# Patient Record
Sex: Female | Born: 1950 | Race: White | Hispanic: No | Marital: Married | Smoking: Never smoker
Health system: Southern US, Community
[De-identification: ages and names within clinical notes are randomized; demographics above are authoritative.]

## PROBLEM LIST (undated history)

## (undated) DIAGNOSIS — M797 Fibromyalgia: Secondary | ICD-10-CM

## (undated) DIAGNOSIS — E039 Hypothyroidism, unspecified: Secondary | ICD-10-CM

## (undated) DIAGNOSIS — K219 Gastro-esophageal reflux disease without esophagitis: Secondary | ICD-10-CM

## (undated) DIAGNOSIS — G510 Bell's palsy: Secondary | ICD-10-CM

## (undated) DIAGNOSIS — I10 Essential (primary) hypertension: Secondary | ICD-10-CM

## (undated) HISTORY — DX: Bell's palsy: G51.0

---

## 2006-04-13 ENCOUNTER — Ambulatory Visit: Payer: Self-pay | Admitting: Cardiology

## 2006-04-21 ENCOUNTER — Ambulatory Visit: Payer: Self-pay | Admitting: Cardiology

## 2006-05-29 ENCOUNTER — Ambulatory Visit: Payer: Self-pay | Admitting: Cardiology

## 2015-05-22 DIAGNOSIS — E039 Hypothyroidism, unspecified: Secondary | ICD-10-CM | POA: Diagnosis not present

## 2015-06-11 DIAGNOSIS — H35033 Hypertensive retinopathy, bilateral: Secondary | ICD-10-CM | POA: Diagnosis not present

## 2015-06-16 DIAGNOSIS — N390 Urinary tract infection, site not specified: Secondary | ICD-10-CM | POA: Diagnosis not present

## 2015-06-16 DIAGNOSIS — Z789 Other specified health status: Secondary | ICD-10-CM | POA: Diagnosis not present

## 2015-06-16 DIAGNOSIS — I10 Essential (primary) hypertension: Secondary | ICD-10-CM | POA: Diagnosis not present

## 2015-08-27 DIAGNOSIS — H2513 Age-related nuclear cataract, bilateral: Secondary | ICD-10-CM | POA: Diagnosis not present

## 2015-09-04 DIAGNOSIS — N952 Postmenopausal atrophic vaginitis: Secondary | ICD-10-CM | POA: Diagnosis not present

## 2015-09-04 DIAGNOSIS — B379 Candidiasis, unspecified: Secondary | ICD-10-CM | POA: Diagnosis not present

## 2015-09-04 DIAGNOSIS — J329 Chronic sinusitis, unspecified: Secondary | ICD-10-CM | POA: Diagnosis not present

## 2015-09-04 DIAGNOSIS — Z299 Encounter for prophylactic measures, unspecified: Secondary | ICD-10-CM | POA: Diagnosis not present

## 2015-09-23 DIAGNOSIS — Z1389 Encounter for screening for other disorder: Secondary | ICD-10-CM | POA: Diagnosis not present

## 2015-09-23 DIAGNOSIS — E039 Hypothyroidism, unspecified: Secondary | ICD-10-CM | POA: Diagnosis not present

## 2015-09-23 DIAGNOSIS — R5383 Other fatigue: Secondary | ICD-10-CM | POA: Diagnosis not present

## 2015-09-23 DIAGNOSIS — Z79899 Other long term (current) drug therapy: Secondary | ICD-10-CM | POA: Diagnosis not present

## 2015-09-23 DIAGNOSIS — Z6826 Body mass index (BMI) 26.0-26.9, adult: Secondary | ICD-10-CM | POA: Diagnosis not present

## 2015-09-23 DIAGNOSIS — Z01419 Encounter for gynecological examination (general) (routine) without abnormal findings: Secondary | ICD-10-CM | POA: Diagnosis not present

## 2015-09-23 DIAGNOSIS — E7800 Pure hypercholesterolemia, unspecified: Secondary | ICD-10-CM | POA: Diagnosis not present

## 2015-09-23 DIAGNOSIS — Z0000 Encounter for general adult medical examination without abnormal findings: Secondary | ICD-10-CM | POA: Diagnosis not present

## 2015-09-23 DIAGNOSIS — Z299 Encounter for prophylactic measures, unspecified: Secondary | ICD-10-CM | POA: Diagnosis not present

## 2015-09-23 DIAGNOSIS — Z7189 Other specified counseling: Secondary | ICD-10-CM | POA: Diagnosis not present

## 2015-09-23 DIAGNOSIS — Z1211 Encounter for screening for malignant neoplasm of colon: Secondary | ICD-10-CM | POA: Diagnosis not present

## 2015-10-12 DIAGNOSIS — H52223 Regular astigmatism, bilateral: Secondary | ICD-10-CM | POA: Diagnosis not present

## 2015-10-12 DIAGNOSIS — H2513 Age-related nuclear cataract, bilateral: Secondary | ICD-10-CM | POA: Diagnosis not present

## 2015-10-12 DIAGNOSIS — H5213 Myopia, bilateral: Secondary | ICD-10-CM | POA: Diagnosis not present

## 2015-10-12 DIAGNOSIS — H524 Presbyopia: Secondary | ICD-10-CM | POA: Diagnosis not present

## 2015-11-12 DIAGNOSIS — H9193 Unspecified hearing loss, bilateral: Secondary | ICD-10-CM | POA: Diagnosis not present

## 2015-11-12 DIAGNOSIS — H6123 Impacted cerumen, bilateral: Secondary | ICD-10-CM | POA: Diagnosis not present

## 2015-11-20 DIAGNOSIS — H2512 Age-related nuclear cataract, left eye: Secondary | ICD-10-CM | POA: Diagnosis not present

## 2015-12-02 NOTE — Patient Instructions (Addendum)
Your procedure is scheduled on: 12/07/2015   Report to [facility] at  615   AM.  Call this number if you have problems the morning of surgery: [phone]   Do not eat food or drink liquids :After Midnight.      Take these medicines the morning of surgery with A SIP OF WATER: Norvasc, claritin and synthroid.   Do not wear jewelry, make-up or nail polish.  Do not wear lotions, powders, or perfumes. You may wear deodorant.  Do not shave 48 hours prior to surgery.  Do not bring valuables to the hospital.  Contacts, dentures or bridgework may not be worn into surgery.  Leave suitcase in the car. After surgery it may be brought to your room.  For patients admitted to the hospital, checkout time is 11:00 AM the day of discharge.   Patients discharged the day of surgery will not be allowed to drive home.  :     Please read over the following fact sheets that you were given: Coughing and Deep Breathing, Surgical Site Infection Prevention, Anesthesia Post-op Instructions and Care and Recovery After Surgery    Cataract A cataract is a clouding of the lens of the eye. When a lens becomes cloudy, vision is reduced based on the degree and nature of the clouding. Many cataracts reduce vision to some degree. Some cataracts make people more near-sighted as they develop. Other cataracts increase glare. Cataracts that are ignored and become worse can sometimes look white. The white color can be seen through the pupil. CAUSES   Aging. However, cataracts may occur at any age, even in newborns.   Certain drugs.   Trauma to the eye.   Certain diseases such as diabetes.   Specific eye diseases such as chronic inflammation inside the eye or a sudden attack of a rare form of glaucoma.   Inherited or acquired medical problems.  SYMPTOMS   Gradual, progressive drop in vision in the affected eye.   Severe, rapid visual loss. This most often happens when trauma is the cause.  DIAGNOSIS  To detect a  cataract, an eye doctor examines the lens. Cataracts are best diagnosed with an exam of the eyes with the pupils enlarged (dilated) by drops.  TREATMENT  For an early cataract, vision may improve by using different eyeglasses or stronger lighting. If that does not help your vision, surgery is the only effective treatment. A cataract needs to be surgically removed when vision loss interferes with your everyday activities, such as driving, reading, or watching TV. A cataract may also have to be removed if it prevents examination or treatment of another eye problem. Surgery removes the cloudy lens and usually replaces it with a substitute lens (intraocular lens, IOL).  At a time when both you and your doctor agree, the cataract will be surgically removed. If you have cataracts in both eyes, only one is usually removed at a time. This allows the operated eye to heal and be out of danger from any possible problems after surgery (such as infection or poor wound healing). In rare cases, a cataract may be doing damage to your eye. In these cases, your caregiver may advise surgical removal right away. The vast majority of people who have cataract surgery have better vision afterward. HOME CARE INSTRUCTIONS  If you are not planning surgery, you may be asked to do the following:  Use different eyeglasses.   Use stronger or brighter lighting.   Ask your eye doctor about  reducing your medicine dose or changing medicines if it is thought that a medicine caused your cataract. Changing medicines does not make the cataract go away on its own.   Become familiar with your surroundings. Poor vision can lead to injury. Avoid bumping into things on the affected side. You are at a higher risk for tripping or falling.   Exercise extreme care when driving or operating machinery.   Wear sunglasses if you are sensitive to bright light or experiencing problems with glare.  SEEK IMMEDIATE MEDICAL CARE IF:   You have a  worsening or sudden vision loss.   You notice redness, swelling, or increasing pain in the eye.   You have a fever.  Document Released: 04/11/2005 Document Revised: 03/31/2011 Document Reviewed: 12/03/2010 [facility] Patient Information 2012 [place].PATIENT INSTRUCTIONS POST-ANESTHESIA  IMMEDIATELY FOLLOWING SURGERY:  Do not drive or operate machinery for the first twenty four hours after surgery.  Do not make any important decisions for twenty four hours after surgery or while taking narcotic pain medications or sedatives.  If you develop intractable nausea and vomiting or a severe headache please notify your doctor immediately.  FOLLOW-UP:  Please make an appointment with your surgeon as instructed. You do not need to follow up with anesthesia unless specifically instructed to do so.  WOUND CARE INSTRUCTIONS (if applicable):  Keep a dry clean dressing on the anesthesia/puncture wound site if there is drainage.  Once the wound has quit draining you may leave it open to air.  Generally you should leave the bandage intact for twenty four hours unless there is drainage.  If the epidural site drains for more than 36-48 hours please call the anesthesia department.  QUESTIONS?:  Please feel free to call your physician or the hospital operator if you have any questions, and they will be happy to assist you.

## 2015-12-03 ENCOUNTER — Encounter (HOSPITAL_COMMUNITY): Payer: Self-pay

## 2015-12-03 ENCOUNTER — Encounter (HOSPITAL_COMMUNITY)
Admission: RE | Admit: 2015-12-03 | Discharge: 2015-12-03 | Disposition: A | Discharge status: Home / Self Care | Payer: Medicare Other | Source: Ambulatory Visit | Attending: Ophthalmology | Admitting: Ophthalmology

## 2015-12-03 ENCOUNTER — Other Ambulatory Visit: Payer: Self-pay

## 2015-12-03 DIAGNOSIS — Z01812 Encounter for preprocedural laboratory examination: Secondary | ICD-10-CM | POA: Insufficient documentation

## 2015-12-03 DIAGNOSIS — Z01810 Encounter for preprocedural cardiovascular examination: Secondary | ICD-10-CM | POA: Insufficient documentation

## 2015-12-03 HISTORY — DX: Fibromyalgia: M79.7

## 2015-12-03 HISTORY — DX: Hypothyroidism, unspecified: E03.9

## 2015-12-03 HISTORY — DX: Gastro-esophageal reflux disease without esophagitis: K21.9

## 2015-12-03 HISTORY — DX: Essential (primary) hypertension: I10

## 2015-12-03 LAB — CBC WITH DIFFERENTIAL/PLATELET
BASOS ABS: 0 10*3/uL (ref 0.0–0.1)
Basophils Relative: 0 %
EOS PCT: 2 %
Eosinophils Absolute: 0.2 10*3/uL (ref 0.0–0.7)
HCT: 40.6 % (ref 36.0–46.0)
Hemoglobin: 13.5 g/dL (ref 12.0–15.0)
LYMPHS PCT: 32 %
Lymphs Abs: 2.2 10*3/uL (ref 0.7–4.0)
MCH: 28.4 pg (ref 26.0–34.0)
MCHC: 33.3 g/dL (ref 30.0–36.0)
MCV: 85.5 fL (ref 78.0–100.0)
Monocytes Absolute: 0.6 10*3/uL (ref 0.1–1.0)
Monocytes Relative: 8 %
NEUTROS ABS: 4.1 10*3/uL (ref 1.7–7.7)
Neutrophils Relative %: 58 %
PLATELETS: 269 10*3/uL (ref 150–400)
RBC: 4.75 MIL/uL (ref 3.87–5.11)
RDW: 13.9 % (ref 11.5–15.5)
WBC: 7.1 10*3/uL (ref 4.0–10.5)

## 2015-12-03 LAB — BASIC METABOLIC PANEL
ANION GAP: 9 (ref 5–15)
BUN: 22 mg/dL — ABNORMAL HIGH (ref 6–20)
CO2: 25 mmol/L (ref 22–32)
Calcium: 9.2 mg/dL (ref 8.9–10.3)
Chloride: 104 mmol/L (ref 101–111)
Creatinine, Ser: 0.83 mg/dL (ref 0.44–1.00)
GFR calc Af Amer: >60 mL/min (ref >60)
GLUCOSE: 77 mg/dL (ref 65–99)
POTASSIUM: 4.1 mmol/L (ref 3.5–5.1)
Sodium: 138 mmol/L (ref 135–145)

## 2015-12-03 NOTE — Pre-Procedure Instructions (Signed)
Patient given information to sign up for my chart at home. 

## 2015-12-04 MED ORDER — LIDOCAINE HCL (PF) 1 % IJ SOLN
INTRAMUSCULAR | Status: AC
  Filled 2015-12-04: qty 2

## 2015-12-04 MED ORDER — TETRACAINE HCL 0.5 % OP SOLN
OPHTHALMIC | Status: AC
  Filled 2015-12-04: qty 4

## 2015-12-04 MED ORDER — CYCLOPENTOLATE-PHENYLEPHRINE 0.2-1 % OP SOLN
OPHTHALMIC | Status: AC
  Filled 2015-12-04: qty 2

## 2015-12-04 MED ORDER — NEOMYCIN-POLYMYXIN-DEXAMETH 3.5-10000-0.1 OP SUSP
OPHTHALMIC | Status: AC
  Filled 2015-12-04: qty 5

## 2015-12-04 MED ORDER — PHENYLEPHRINE HCL 2.5 % OP SOLN
OPHTHALMIC | Status: AC
  Filled 2015-12-04: qty 15

## 2015-12-04 MED ORDER — LIDOCAINE HCL 3.5 % OP GEL
OPHTHALMIC | Status: AC
  Filled 2015-12-04: qty 1

## 2015-12-07 ENCOUNTER — Ambulatory Visit (HOSPITAL_COMMUNITY)
Admission: RE | Admit: 2015-12-07 | Discharge: 2015-12-07 | Disposition: A | Discharge status: Home / Self Care | Payer: Medicare Other | Source: Ambulatory Visit | Attending: Ophthalmology | Admitting: Ophthalmology

## 2015-12-07 ENCOUNTER — Ambulatory Visit (HOSPITAL_COMMUNITY): Payer: Medicare Other | Admitting: Anesthesiology

## 2015-12-07 ENCOUNTER — Encounter (HOSPITAL_COMMUNITY): Payer: Self-pay | Admitting: *Deleted

## 2015-12-07 ENCOUNTER — Encounter (HOSPITAL_COMMUNITY)
Admission: RE | Disposition: A | Discharge status: Home / Self Care | Payer: Self-pay | Source: Ambulatory Visit | Attending: Ophthalmology

## 2015-12-07 DIAGNOSIS — M797 Fibromyalgia: Secondary | ICD-10-CM | POA: Insufficient documentation

## 2015-12-07 DIAGNOSIS — H2512 Age-related nuclear cataract, left eye: Secondary | ICD-10-CM | POA: Insufficient documentation

## 2015-12-07 DIAGNOSIS — E039 Hypothyroidism, unspecified: Secondary | ICD-10-CM | POA: Diagnosis not present

## 2015-12-07 DIAGNOSIS — Z882 Allergy status to sulfonamides status: Secondary | ICD-10-CM | POA: Diagnosis not present

## 2015-12-07 DIAGNOSIS — K219 Gastro-esophageal reflux disease without esophagitis: Secondary | ICD-10-CM | POA: Insufficient documentation

## 2015-12-07 DIAGNOSIS — I10 Essential (primary) hypertension: Secondary | ICD-10-CM | POA: Insufficient documentation

## 2015-12-07 DIAGNOSIS — H269 Unspecified cataract: Secondary | ICD-10-CM | POA: Diagnosis not present

## 2015-12-07 DIAGNOSIS — Z880 Allergy status to penicillin: Secondary | ICD-10-CM | POA: Diagnosis not present

## 2015-12-07 HISTORY — PX: CATARACT EXTRACTION W/PHACO: SHX586

## 2015-12-07 SURGERY — PHACOEMULSIFICATION, CATARACT, WITH IOL INSERTION
Anesthesia: Monitor Anesthesia Care | Site: Eye | Laterality: Left

## 2015-12-07 MED ORDER — BSS IO SOLN
INTRAOCULAR | Status: DC | PRN
  Administered 2015-12-07: 500 mL

## 2015-12-07 MED ORDER — MIDAZOLAM HCL 2 MG/2ML IJ SOLN
1.0000 mg | INTRAMUSCULAR | Status: DC | PRN
Start: 2015-12-07 — End: 2015-12-07
  Administered 2015-12-07: 2 mg via INTRAVENOUS

## 2015-12-07 MED ORDER — NEOMYCIN-POLYMYXIN-DEXAMETH 3.5-10000-0.1 OP SUSP
OPHTHALMIC | Status: DC | PRN
  Administered 2015-12-07: 2 [drp] via OPHTHALMIC

## 2015-12-07 MED ORDER — BSS IO SOLN
INTRAOCULAR | Status: DC | PRN
  Administered 2015-12-07: 15 mL via INTRAOCULAR

## 2015-12-07 MED ORDER — POVIDONE-IODINE 5 % OP SOLN
OPHTHALMIC | Status: DC | PRN
  Administered 2015-12-07: 1 via OPHTHALMIC

## 2015-12-07 MED ORDER — LIDOCAINE HCL 3.5 % OP GEL
1.0000 "application " | Freq: Once | OPHTHALMIC | Status: AC
  Administered 2015-12-07: 1 via OPHTHALMIC

## 2015-12-07 MED ORDER — EPINEPHRINE HCL 1 MG/ML IJ SOLN
INTRAMUSCULAR | Status: AC
  Filled 2015-12-07: qty 1

## 2015-12-07 MED ORDER — TETRACAINE HCL 0.5 % OP SOLN
1.0000 [drp] | OPHTHALMIC | Status: AC
  Administered 2015-12-07 (×3): 1 [drp] via OPHTHALMIC

## 2015-12-07 MED ORDER — PHENYLEPHRINE HCL 2.5 % OP SOLN
1.0000 [drp] | OPHTHALMIC | Status: AC
  Administered 2015-12-07 (×3): 1 [drp] via OPHTHALMIC

## 2015-12-07 MED ORDER — LACTATED RINGERS IV SOLN
INTRAVENOUS | Status: DC
  Administered 2015-12-07: 07:00:00 via INTRAVENOUS

## 2015-12-07 MED ORDER — FENTANYL CITRATE (PF) 100 MCG/2ML IJ SOLN
25.0000 ug | INTRAMUSCULAR | Status: DC | PRN
  Administered 2015-12-07: 25 ug via INTRAVENOUS

## 2015-12-07 MED ORDER — LIDOCAINE HCL (PF) 1 % IJ SOLN
INTRAMUSCULAR | Status: DC | PRN
  Administered 2015-12-07: .3 mL

## 2015-12-07 MED ORDER — MIDAZOLAM HCL 5 MG/5ML IJ SOLN
INTRAMUSCULAR | Status: DC | PRN
  Administered 2015-12-07: 1 mg via INTRAVENOUS

## 2015-12-07 MED ORDER — FENTANYL CITRATE (PF) 100 MCG/2ML IJ SOLN
INTRAMUSCULAR | Status: AC
  Filled 2015-12-07: qty 2

## 2015-12-07 MED ORDER — MIDAZOLAM HCL 2 MG/2ML IJ SOLN
INTRAMUSCULAR | Status: AC
  Filled 2015-12-07: qty 2

## 2015-12-07 MED ORDER — PROVISC 10 MG/ML IO SOLN
INTRAOCULAR | Status: DC | PRN
  Administered 2015-12-07: 0.85 mL via INTRAOCULAR

## 2015-12-07 MED ORDER — CYCLOPENTOLATE-PHENYLEPHRINE 0.2-1 % OP SOLN
1.0000 [drp] | OPHTHALMIC | Status: AC
  Administered 2015-12-07 (×3): 1 [drp] via OPHTHALMIC

## 2015-12-07 MED ORDER — LIDOCAINE 3.5 % OP GEL OPTIME - NO CHARGE
OPHTHALMIC | Status: DC | PRN
  Administered 2015-12-07: 2 [drp] via OPHTHALMIC

## 2015-12-07 SURGICAL SUPPLY — 23 items
CAPSULAR TENSION RING-AMO (OPHTHALMIC RELATED) IMPLANT
CLOTH BEACON ORANGE TIMEOUT ST (SAFETY) ×3 IMPLANT
EYE SHIELD UNIVERSAL CLEAR (GAUZE/BANDAGES/DRESSINGS) ×3 IMPLANT
GLOVE BIOGEL PI IND STRL 7.0 (GLOVE) ×2 IMPLANT
GLOVE BIOGEL PI IND STRL 7.5 (GLOVE) IMPLANT
GLOVE BIOGEL PI INDICATOR 7.0 (GLOVE) ×4
GLOVE BIOGEL PI INDICATOR 7.5 (GLOVE)
GLOVE EXAM NITRILE LRG STRL (GLOVE) IMPLANT
GLOVE EXAM NITRILE MD LF STRL (GLOVE) ×3 IMPLANT
KIT VITRECTOMY (OPHTHALMIC RELATED) IMPLANT
LENS ALC ACRYL/TECN (Ophthalmic Related) ×3 IMPLANT
PAD ARMBOARD 7.5X6 YLW CONV (MISCELLANEOUS) ×3 IMPLANT
PROC W NO LENS (INTRAOCULAR LENS)
PROC W SPEC LENS (INTRAOCULAR LENS) ×3
PROCESS W NO LENS (INTRAOCULAR LENS) IMPLANT
PROCESS W SPEC LENS (INTRAOCULAR LENS) ×1 IMPLANT
RETRACTOR IRIS SIGHTPATH (OPHTHALMIC RELATED) IMPLANT
RING MALYGIN (MISCELLANEOUS) IMPLANT
SYRINGE LUER LOK 1CC (MISCELLANEOUS) ×3 IMPLANT
TAPE SURG TRANSPORE 1 IN (GAUZE/BANDAGES/DRESSINGS) ×1 IMPLANT
TAPE SURGICAL TRANSPORE 1 IN (GAUZE/BANDAGES/DRESSINGS) ×2
VISCOELASTIC ADDITIONAL (OPHTHALMIC RELATED) IMPLANT
WATER STERILE IRR 250ML POUR (IV SOLUTION) ×3 IMPLANT

## 2015-12-07 NOTE — H&P (Signed)
I have reviewed the H&P, the patient was re-examined, and I have identified no interval changes in medical condition and plan of care since the history and physical of record  

## 2015-12-07 NOTE — Op Note (Signed)
Date of Admission: 12/07/2015  Date of Surgery: 12/07/2015   Pre-Op Dx: Cataract Left Eye  Post-Op Dx: Senile Nuclear Cataract Left  Eye,  Dx Code H25.12  Surgeon: Gemma PayorKerry Kj Imbert, M.D.  Assistants: None  Anesthesia: Topical with MAC  Indications: Painless, progressive loss of vision with compromise of daily activities.  Surgery: Cataract Extraction with Intraocular lens Implant Left Eye  Discription: The patient had dilating drops and viscous lidocaine placed into the Left eye in the pre-op holding area. After transfer to the operating room, a time out was performed. The patient was then prepped and draped. Beginning with a 75 degree blade a paracentesis port was made at the surgeon's 2 o'clock position. The anterior chamber was then filled with 1% non-preserved lidocaine. This was followed by filling the anterior chamber with Provisc.  A 2.224mm keratome blade was used to make a clear corneal incision at the temporal limbus.  A bent cystatome needle was used to create a continuous tear capsulotomy. Hydrodissection was performed with balanced salt solution on a Fine canula. The lens nucleus was then removed using the phacoemulsification handpiece. Residual cortex was removed with the I&A handpiece. The anterior chamber and capsular bag were refilled with Provisc. A posterior chamber intraocular lens was placed into the capsular bag with it's injector. The implant was positioned with the Kuglan hook. The Provisc was then removed from the anterior chamber and capsular bag with the I&A handpiece. Stromal hydration of the main incision and paracentesis port was performed with BSS on a Fine canula. The wounds were tested for leak which was negative. The patient tolerated the procedure well. There were no operative complications. The patient was then transferred to the recovery room in stable condition.  Complications: None  Specimen: None  EBL: None  Prosthetic device: Abbott Symfony [id number], power 11.5  D, SN [id number].

## 2015-12-07 NOTE — Anesthesia Preprocedure Evaluation (Signed)
Anesthesia Evaluation  Patient identified by MRN, date of birth, ID band Patient awake    Airway Mallampati: II  TM Distance: >3 FB     Dental  (+) Teeth Intact   Pulmonary neg pulmonary ROS,    breath sounds clear to auscultation       Cardiovascular hypertension, Pt. on medications  Rhythm:Regular Rate:Normal     Neuro/Psych    GI/Hepatic GERD  ,  Endo/Other  Hypothyroidism   Renal/GU      Musculoskeletal  (+) Fibromyalgia -  Abdominal   Peds  Hematology   Anesthesia Other Findings   Reproductive/Obstetrics                             Anesthesia Physical Anesthesia Plan  ASA: II  Anesthesia Plan: MAC   Post-op Pain Management:    Induction: Intravenous  Airway Management Planned: Nasal Cannula  Additional Equipment:   Intra-op Plan:   Post-operative Plan:   Informed Consent: I have reviewed the patients History and Physical, chart, labs and discussed the procedure including the risks, benefits and alternatives for the proposed anesthesia with the patient or authorized representative who has indicated his/her understanding and acceptance.     Plan Discussed with:   Anesthesia Plan Comments:         Anesthesia Quick Evaluation

## 2015-12-07 NOTE — Transfer of Care (Signed)
Immediate Anesthesia Transfer of Care Note  Patient: Madeline Middleton  Procedure(s) Performed: Procedure(s): CATARACT EXTRACTION PHACO AND INTRAOCULAR LENS PLACEMENT LEFT EYE; CDE:  4.54 (Left)  Patient Location: Short Stay  Anesthesia Type:MAC  Level of Consciousness: awake  Airway & Oxygen Therapy: Patient Spontanous Breathing  Post-op Assessment: Report given to RN  Post vital signs: Reviewed  Last Vitals:  Vitals:   12/07/15 0700 12/07/15 0705  BP: (!) 154/83 (!) 166/83  Resp: 14 15  Temp:      Last Pain:  Vitals:   12/07/15 0637  TempSrc: Oral      Patients Stated Pain Goal: 6 (12/07/15 [id number])  Complications: No apparent anesthesia complications

## 2015-12-07 NOTE — Anesthesia Postprocedure Evaluation (Signed)
Anesthesia Post Note  Patient: Madeline Middleton  Procedure(s) Performed: Procedure(s) (LRB): CATARACT EXTRACTION PHACO AND INTRAOCULAR LENS PLACEMENT LEFT EYE; CDE:  4.54 (Left)  Patient location during evaluation: Short Stay Anesthesia Type: MAC Level of consciousness: awake and alert and oriented Pain management: pain level controlled Vital Signs Assessment: post-procedure vital signs reviewed and stable Respiratory status: spontaneous breathing Cardiovascular status: stable Postop Assessment: no signs of nausea or vomiting Anesthetic complications: no    Last Vitals:  Vitals:   12/07/15 0700 12/07/15 0705  BP: (!) 154/83 (!) 166/83  Resp: 14 15  Temp:      Last Pain:  Vitals:   12/07/15 0637  TempSrc: Oral                 Riggins Cisek

## 2015-12-07 NOTE — Discharge Instructions (Signed)

## 2015-12-11 ENCOUNTER — Encounter (HOSPITAL_COMMUNITY): Payer: Self-pay

## 2015-12-14 ENCOUNTER — Encounter (HOSPITAL_COMMUNITY)
Admission: RE | Admit: 2015-12-14 | Discharge: 2015-12-14 | Disposition: A | Discharge status: Home / Self Care | Payer: Medicare Other | Source: Ambulatory Visit | Attending: Ophthalmology | Admitting: Ophthalmology

## 2015-12-14 DIAGNOSIS — H5231 Anisometropia: Secondary | ICD-10-CM | POA: Diagnosis not present

## 2015-12-14 DIAGNOSIS — Z961 Presence of intraocular lens: Secondary | ICD-10-CM | POA: Diagnosis not present

## 2015-12-14 DIAGNOSIS — H2511 Age-related nuclear cataract, right eye: Secondary | ICD-10-CM | POA: Diagnosis not present

## 2015-12-14 DIAGNOSIS — H52223 Regular astigmatism, bilateral: Secondary | ICD-10-CM | POA: Diagnosis not present

## 2015-12-16 ENCOUNTER — Encounter (HOSPITAL_COMMUNITY): Payer: Self-pay | Admitting: Anesthesiology

## 2015-12-16 NOTE — Anesthesia Preprocedure Evaluation (Addendum)
Anesthesia Evaluation  Patient identified by MRN, date of birth, ID band Patient awake    Reviewed: Allergy & Precautions, NPO status , Patient's Chart, lab work & pertinent test results  Airway Mallampati: II  TM Distance: >3 FB     Dental  (+) Teeth Intact   Pulmonary neg pulmonary ROS,    breath sounds clear to auscultation       Cardiovascular hypertension, Pt. on medications  Rhythm:Regular Rate:Normal     Neuro/Psych Cataract OD  Neuromuscular disease negative psych ROS   GI/Hepatic Neg liver ROS, GERD  ,  Endo/Other  Hypothyroidism   Renal/GU negative Renal ROS  negative genitourinary   Musculoskeletal  (+) Fibromyalgia -  Abdominal Normal abdominal exam  (+)   Peds  Hematology negative hematology ROS (+)   Anesthesia Other Findings   Reproductive/Obstetrics                            Anesthesia Physical EKG: normal sinus rhythm, Q waves in II,III, aVF.  Lab Results  Component Value Date   WBC 7.1 12/03/2015   HGB 13.5 12/03/2015   HCT 40.6 12/03/2015   MCV 85.5 12/03/2015   PLT 269 12/03/2015     Chemistry      Component Value Date/Time   NA 138 12/03/2015 1146   K 4.1 12/03/2015 1146   CL 104 12/03/2015 1146   CO2 25 12/03/2015 1146   BUN 22 (H) 12/03/2015 1146   CREATININE 0.83 12/03/2015 1146      Component Value Date/Time   CALCIUM 9.2 12/03/2015 1146      Anesthesia Plan  ASA: II  Anesthesia Plan: MAC   Post-op Pain Management:    Induction: Intravenous  Airway Management Planned: Nasal Cannula and Natural Airway  Additional Equipment:   Intra-op Plan:   Post-operative Plan:   Informed Consent: I have reviewed the patients History and Physical, chart, labs and discussed the procedure including the risks, benefits and alternatives for the proposed anesthesia with the patient or authorized representative who has indicated his/her understanding and  acceptance.     Plan Discussed with: Anesthesiologist, CRNA and Surgeon  Anesthesia Plan Comments:         Anesthesia Quick Evaluation

## 2015-12-17 ENCOUNTER — Encounter (HOSPITAL_COMMUNITY)
Admission: RE | Disposition: A | Discharge status: Home / Self Care | Payer: Self-pay | Source: Ambulatory Visit | Attending: Ophthalmology

## 2015-12-17 ENCOUNTER — Encounter (HOSPITAL_COMMUNITY): Payer: Self-pay | Admitting: Ophthalmology

## 2015-12-17 ENCOUNTER — Ambulatory Visit (HOSPITAL_COMMUNITY)
Admission: RE | Admit: 2015-12-17 | Discharge: 2015-12-17 | Disposition: A | Discharge status: Home / Self Care | Payer: Medicare Other | Source: Ambulatory Visit | Attending: Ophthalmology | Admitting: Ophthalmology

## 2015-12-17 ENCOUNTER — Ambulatory Visit (HOSPITAL_COMMUNITY): Payer: Medicare Other | Admitting: Anesthesiology

## 2015-12-17 DIAGNOSIS — K219 Gastro-esophageal reflux disease without esophagitis: Secondary | ICD-10-CM | POA: Diagnosis not present

## 2015-12-17 DIAGNOSIS — Z79899 Other long term (current) drug therapy: Secondary | ICD-10-CM | POA: Diagnosis not present

## 2015-12-17 DIAGNOSIS — E039 Hypothyroidism, unspecified: Secondary | ICD-10-CM | POA: Insufficient documentation

## 2015-12-17 DIAGNOSIS — H2511 Age-related nuclear cataract, right eye: Secondary | ICD-10-CM | POA: Diagnosis not present

## 2015-12-17 DIAGNOSIS — I10 Essential (primary) hypertension: Secondary | ICD-10-CM | POA: Insufficient documentation

## 2015-12-17 DIAGNOSIS — M797 Fibromyalgia: Secondary | ICD-10-CM | POA: Diagnosis not present

## 2015-12-17 DIAGNOSIS — Z7951 Long term (current) use of inhaled steroids: Secondary | ICD-10-CM | POA: Insufficient documentation

## 2015-12-17 HISTORY — PX: CATARACT EXTRACTION W/PHACO: SHX586

## 2015-12-17 SURGERY — PHACOEMULSIFICATION, CATARACT, WITH IOL INSERTION
Anesthesia: Monitor Anesthesia Care | Site: Eye | Laterality: Right

## 2015-12-17 MED ORDER — CYCLOPENTOLATE-PHENYLEPHRINE 0.2-1 % OP SOLN
1.0000 [drp] | OPHTHALMIC | Status: AC
  Administered 2015-12-17 (×3): 1 [drp] via OPHTHALMIC

## 2015-12-17 MED ORDER — FENTANYL CITRATE (PF) 100 MCG/2ML IJ SOLN
25.0000 ug | INTRAMUSCULAR | Status: AC
  Administered 2015-12-17: 25 ug via INTRAVENOUS

## 2015-12-17 MED ORDER — PROVISC 10 MG/ML IO SOLN
INTRAOCULAR | Status: DC | PRN
  Administered 2015-12-17: 0.85 mL via INTRAOCULAR

## 2015-12-17 MED ORDER — TETRACAINE HCL 0.5 % OP SOLN
1.0000 [drp] | OPHTHALMIC | Status: AC
  Administered 2015-12-17 (×2): 1 [drp] via OPHTHALMIC

## 2015-12-17 MED ORDER — MIDAZOLAM HCL 2 MG/2ML IJ SOLN
INTRAMUSCULAR | Status: AC
  Filled 2015-12-17: qty 2

## 2015-12-17 MED ORDER — LACTATED RINGERS IV SOLN
INTRAVENOUS | Status: DC
  Administered 2015-12-17: 11:00:00 via INTRAVENOUS

## 2015-12-17 MED ORDER — NEOMYCIN-POLYMYXIN-DEXAMETH 3.5-10000-0.1 OP SUSP
OPHTHALMIC | Status: DC | PRN
  Administered 2015-12-17: 2 [drp] via OPHTHALMIC

## 2015-12-17 MED ORDER — MIDAZOLAM HCL 2 MG/2ML IJ SOLN
1.0000 mg | INTRAMUSCULAR | Status: AC
  Administered 2015-12-17: 2 mg via INTRAVENOUS

## 2015-12-17 MED ORDER — LIDOCAINE HCL (PF) 1 % IJ SOLN
INTRAMUSCULAR | Status: DC | PRN
  Administered 2015-12-17: .4 mL

## 2015-12-17 MED ORDER — EPINEPHRINE HCL 1 MG/ML IJ SOLN
INTRAOCULAR | Status: DC | PRN
  Administered 2015-12-17: 500 mL

## 2015-12-17 MED ORDER — BSS IO SOLN
INTRAOCULAR | Status: DC | PRN
  Administered 2015-12-17: 15 mL via INTRAOCULAR

## 2015-12-17 MED ORDER — LIDOCAINE 3.5 % OP GEL OPTIME - NO CHARGE
OPHTHALMIC | Status: DC | PRN
  Administered 2015-12-17: 1 [drp] via OPHTHALMIC

## 2015-12-17 MED ORDER — LIDOCAINE HCL 3.5 % OP GEL: 1.0000 "application " | Freq: Once | OPHTHALMIC | Status: DC

## 2015-12-17 MED ORDER — POVIDONE-IODINE 5 % OP SOLN
OPHTHALMIC | Status: DC | PRN
  Administered 2015-12-17: 1 via OPHTHALMIC

## 2015-12-17 MED ORDER — FENTANYL CITRATE (PF) 100 MCG/2ML IJ SOLN
INTRAMUSCULAR | Status: AC
  Filled 2015-12-17: qty 2

## 2015-12-17 MED ORDER — PHENYLEPHRINE HCL 2.5 % OP SOLN
1.0000 [drp] | OPHTHALMIC | Status: AC
  Administered 2015-12-17 (×3): 1 [drp] via OPHTHALMIC

## 2015-12-17 MED ORDER — EPINEPHRINE HCL 1 MG/ML IJ SOLN
INTRAMUSCULAR | Status: AC
  Filled 2015-12-17: qty 1

## 2015-12-17 SURGICAL SUPPLY — 23 items
CAPSULAR TENSION RING-AMO (OPHTHALMIC RELATED) IMPLANT
CLOTH BEACON ORANGE TIMEOUT ST (SAFETY) ×3 IMPLANT
EYE SHIELD UNIVERSAL CLEAR (GAUZE/BANDAGES/DRESSINGS) ×3 IMPLANT
GLOVE BIOGEL PI IND STRL 7.0 (GLOVE) ×1 IMPLANT
GLOVE BIOGEL PI IND STRL 7.5 (GLOVE) IMPLANT
GLOVE BIOGEL PI INDICATOR 7.0 (GLOVE) ×2
GLOVE BIOGEL PI INDICATOR 7.5 (GLOVE)
GLOVE EXAM NITRILE LRG STRL (GLOVE) IMPLANT
GLOVE EXAM NITRILE MD LF STRL (GLOVE) ×3 IMPLANT
KIT VITRECTOMY (OPHTHALMIC RELATED) IMPLANT
LENS ALC ACRYL/TECN (Ophthalmic Related) ×3 IMPLANT
PAD ARMBOARD 7.5X6 YLW CONV (MISCELLANEOUS) ×3 IMPLANT
PROC W NO LENS (INTRAOCULAR LENS)
PROC W SPEC LENS (INTRAOCULAR LENS) ×3
PROCESS W NO LENS (INTRAOCULAR LENS) IMPLANT
PROCESS W SPEC LENS (INTRAOCULAR LENS) ×1 IMPLANT
RETRACTOR IRIS SIGHTPATH (OPHTHALMIC RELATED) IMPLANT
RING MALYGIN (MISCELLANEOUS) IMPLANT
SYRINGE LUER LOK 1CC (MISCELLANEOUS) ×3 IMPLANT
TAPE SURG TRANSPORE 1 IN (GAUZE/BANDAGES/DRESSINGS) ×1 IMPLANT
TAPE SURGICAL TRANSPORE 1 IN (GAUZE/BANDAGES/DRESSINGS) ×2
VISCOELASTIC ADDITIONAL (OPHTHALMIC RELATED) IMPLANT
WATER STERILE IRR 250ML POUR (IV SOLUTION) ×3 IMPLANT

## 2015-12-17 NOTE — Transfer of Care (Signed)
Immediate Anesthesia Transfer of Care Note  Patient: Madeline Middleton  Procedure(s) Performed: Procedure(s) (LRB): CATARACT EXTRACTION PHACO AND INTRAOCULAR LENS PLACEMENT ; CDE:  5.44 (Right)  Patient Location: Shortstay  Anesthesia Type: MAC  Level of Consciousness: awake  Airway & Oxygen Therapy: Patient Spontanous Breathing   Post-op Assessment: Report given to PACU RN, Post -op Vital signs reviewed and stable and Patient moving all extremities  Post vital signs: Reviewed and stable  Complications: No apparent anesthesia complications

## 2015-12-17 NOTE — Op Note (Signed)
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  Assistants: None  Anesthesia: Topical with MAC  Indications: Painless, progressive loss of vision with compromise of daily activities.  Surgery: Cataract Extraction with Intraocular lens Implant Right Eye  Discription: The patient had dilating drops and viscous lidocaine placed into the Right in the pre-op holding area. In the sitting position horizontal reference marks were made on the cornea.  After transfer to the operating room, a time out was performed. The patient was then prepped and draped. Beginning with a 75 degree blade a paracentesis port was made at the surgeon's 2 o'clock position. The anterior chamber was then filled with 1% non-preserved lidocaine. This was followed by filling the anterior chamber with Provisc. A 2.224mm keratome blade was used to make a clear cornea incision at the temporal limbus. A bent cystatome needle was used to create a continuous tear capsulotomy. Hydrodissection was performed with balanced salt solution on a Fine canula. The lens nucleus was then removed using the phacoemulsification handpiece. Residual cortex was removed with the I&A handpiece. The anterior chamber and capsular bag were refilled with Provisc. A posterior chamber intraocular lens was placed into the capsular bag with it's injector. Additional corneal marks were made on the 86/266 degree meridians.  The Provisc was then removed from the anterior chamber and capsular bag with the I&A handpiece. The implant was positioned with the Kuglan hook. Stromal hydration of the main incision and paracentesis port was performed with BSS on a Fine canula. The wounds were tested for leak which was negative. The patient tolerated the procedure well. There were no operative complications. The  patient was then transferred to the recovery room in stable condition.  Complications: None  Specimen: None  EBL: None  Prosthetic device: Abbott Symfony [place], [id number], power 15.0,  SN [id number].

## 2015-12-17 NOTE — Anesthesia Postprocedure Evaluation (Signed)
Anesthesia Post Note  Patient: Rayetta HumphreyDiane L Mcguffin  Procedure(s) Performed: Procedure(s) (LRB): CATARACT EXTRACTION PHACO AND INTRAOCULAR LENS PLACEMENT ; CDE:  5.44 (Right)  Patient location during evaluation: PACU Anesthesia Type: MAC Level of consciousness: awake and alert and oriented Pain management: pain level controlled Vital Signs Assessment: post-procedure vital signs reviewed and stable Respiratory status: spontaneous breathing, nonlabored ventilation and respiratory function stable Cardiovascular status: stable and blood pressure returned to baseline Postop Assessment: no signs of nausea or vomiting Anesthetic complications: no    Last Vitals:  Vitals:   12/17/15 1100 12/17/15 1153  BP: (!) 162/86 (!) 152/81  Pulse:  72  Resp: (!) 24 18  Temp:  36.6 C    Last Pain:  Vitals:   12/17/15 1153  TempSrc: Oral                 Avyukth Bontempo A.

## 2015-12-17 NOTE — Anesthesia Postprocedure Evaluation (Signed)
Anesthesia Post Note  Patient: Madeline Middleton  Procedure(s) Performed: Procedure(s) (LRB): CATARACT EXTRACTION PHACO AND INTRAOCULAR LENS PLACEMENT ; CDE:  5.44 (Right)  Anesthesia Post Evaluation  Last Vitals:  Vitals:   12/17/15 1100 12/17/15 1153  BP: (!) 162/86 (!) 152/81  Pulse:  72  Resp: (!) 24 18  Temp:  36.6 C    Last Pain:  Vitals:   12/17/15 1153  TempSrc: Oral                 Caral Whan     Anesthesia Post-op Note  Patient: Madeline Middleton  Procedure(s) Performed: Procedure(s) (LRB): CATARACT EXTRACTION PHACO AND INTRAOCULAR LENS PLACEMENT ; CDE:  5.44 (Right)  Patient Location:  Short Stay  Anesthesia Type: MAC  Level of Consciousness: awake  Airway and Oxygen Therapy: Patient Spontanous Breathing  Post-op Pain: none  Post-op Assessment: Post-op Vital signs reviewed, Patient's Cardiovascular Status Stable, Respiratory Function Stable, Patent Airway, No signs of Nausea or vomiting and Pain level controlled  Post-op Vital Signs: Reviewed and stable  Complications: No apparent anesthesia complications

## 2015-12-17 NOTE — Addendum Note (Signed)
Addendum  created 12/17/15 1157 by Mal AmabileMichael Lige Lakeman, MD   Sign clinical note

## 2015-12-17 NOTE — H&P (Signed)
I have reviewed the H&P, the patient was re-examined, and I have identified no interval changes in medical condition and plan of care since the history and physical of record  

## 2015-12-17 NOTE — Discharge Instructions (Signed)

## 2015-12-17 NOTE — Anesthesia Procedure Notes (Signed)
Procedure Name: MAC Date/Time: 12/17/2015 11:29 AM Performed by: Franco NonesYATES, Shantanu Strauch S Pre-anesthesia Checklist: Patient identified, Emergency Drugs available, Suction available, Timeout performed and Patient being monitored Patient Re-evaluated:Patient Re-evaluated prior to inductionOxygen Delivery Method: Nasal Cannula

## 2015-12-23 ENCOUNTER — Encounter (HOSPITAL_COMMUNITY): Payer: Self-pay | Admitting: Ophthalmology

## 2015-12-24 DIAGNOSIS — E039 Hypothyroidism, unspecified: Secondary | ICD-10-CM | POA: Diagnosis not present

## 2015-12-24 DIAGNOSIS — E785 Hyperlipidemia, unspecified: Secondary | ICD-10-CM | POA: Diagnosis not present

## 2015-12-24 DIAGNOSIS — I10 Essential (primary) hypertension: Secondary | ICD-10-CM | POA: Diagnosis not present

## 2016-03-21 DIAGNOSIS — Z961 Presence of intraocular lens: Secondary | ICD-10-CM | POA: Diagnosis not present

## 2016-09-27 DIAGNOSIS — Z1389 Encounter for screening for other disorder: Secondary | ICD-10-CM | POA: Diagnosis not present

## 2016-09-27 DIAGNOSIS — I10 Essential (primary) hypertension: Secondary | ICD-10-CM | POA: Diagnosis not present

## 2016-09-27 DIAGNOSIS — E785 Hyperlipidemia, unspecified: Secondary | ICD-10-CM | POA: Diagnosis not present

## 2016-09-27 DIAGNOSIS — Z0000 Encounter for general adult medical examination without abnormal findings: Secondary | ICD-10-CM | POA: Diagnosis not present

## 2016-09-27 DIAGNOSIS — E663 Overweight: Secondary | ICD-10-CM | POA: Diagnosis not present

## 2016-09-27 DIAGNOSIS — Z299 Encounter for prophylactic measures, unspecified: Secondary | ICD-10-CM | POA: Diagnosis not present

## 2016-09-27 DIAGNOSIS — Z1211 Encounter for screening for malignant neoplasm of colon: Secondary | ICD-10-CM | POA: Diagnosis not present

## 2016-09-27 DIAGNOSIS — Z79899 Other long term (current) drug therapy: Secondary | ICD-10-CM | POA: Diagnosis not present

## 2016-09-27 DIAGNOSIS — E039 Hypothyroidism, unspecified: Secondary | ICD-10-CM | POA: Diagnosis not present

## 2016-09-27 DIAGNOSIS — Z7189 Other specified counseling: Secondary | ICD-10-CM | POA: Diagnosis not present

## 2016-09-28 DIAGNOSIS — Z710 Person encountering health services to consult on behalf of another person: Secondary | ICD-10-CM | POA: Diagnosis not present

## 2016-10-10 DIAGNOSIS — Z1231 Encounter for screening mammogram for malignant neoplasm of breast: Secondary | ICD-10-CM | POA: Diagnosis not present

## 2016-10-19 DIAGNOSIS — R928 Other abnormal and inconclusive findings on diagnostic imaging of breast: Secondary | ICD-10-CM | POA: Diagnosis not present

## 2016-11-08 DIAGNOSIS — E785 Hyperlipidemia, unspecified: Secondary | ICD-10-CM | POA: Diagnosis not present

## 2016-11-08 DIAGNOSIS — I10 Essential (primary) hypertension: Secondary | ICD-10-CM | POA: Diagnosis not present

## 2016-11-08 DIAGNOSIS — E039 Hypothyroidism, unspecified: Secondary | ICD-10-CM | POA: Diagnosis not present

## 2016-11-08 DIAGNOSIS — F329 Major depressive disorder, single episode, unspecified: Secondary | ICD-10-CM | POA: Diagnosis not present

## 2016-11-08 DIAGNOSIS — Z6827 Body mass index (BMI) 27.0-27.9, adult: Secondary | ICD-10-CM | POA: Diagnosis not present

## 2016-11-08 DIAGNOSIS — Z299 Encounter for prophylactic measures, unspecified: Secondary | ICD-10-CM | POA: Diagnosis not present

## 2016-12-27 DIAGNOSIS — E663 Overweight: Secondary | ICD-10-CM | POA: Diagnosis not present

## 2016-12-27 DIAGNOSIS — I10 Essential (primary) hypertension: Secondary | ICD-10-CM | POA: Diagnosis not present

## 2016-12-27 DIAGNOSIS — R5383 Other fatigue: Secondary | ICD-10-CM | POA: Diagnosis not present

## 2016-12-27 DIAGNOSIS — E785 Hyperlipidemia, unspecified: Secondary | ICD-10-CM | POA: Diagnosis not present

## 2016-12-27 DIAGNOSIS — Z299 Encounter for prophylactic measures, unspecified: Secondary | ICD-10-CM | POA: Diagnosis not present

## 2016-12-27 DIAGNOSIS — Z789 Other specified health status: Secondary | ICD-10-CM | POA: Diagnosis not present

## 2016-12-27 DIAGNOSIS — F329 Major depressive disorder, single episode, unspecified: Secondary | ICD-10-CM | POA: Diagnosis not present

## 2016-12-27 DIAGNOSIS — Z1389 Encounter for screening for other disorder: Secondary | ICD-10-CM | POA: Diagnosis not present

## 2016-12-27 DIAGNOSIS — E039 Hypothyroidism, unspecified: Secondary | ICD-10-CM | POA: Diagnosis not present

## 2017-03-22 DIAGNOSIS — Z299 Encounter for prophylactic measures, unspecified: Secondary | ICD-10-CM | POA: Diagnosis not present

## 2017-03-22 DIAGNOSIS — R590 Localized enlarged lymph nodes: Secondary | ICD-10-CM | POA: Diagnosis not present

## 2017-03-22 DIAGNOSIS — K115 Sialolithiasis: Secondary | ICD-10-CM | POA: Diagnosis not present

## 2017-03-22 DIAGNOSIS — I10 Essential (primary) hypertension: Secondary | ICD-10-CM | POA: Diagnosis not present

## 2017-03-22 DIAGNOSIS — Z6826 Body mass index (BMI) 26.0-26.9, adult: Secondary | ICD-10-CM | POA: Diagnosis not present

## 2017-03-27 DIAGNOSIS — K1120 Sialoadenitis, unspecified: Secondary | ICD-10-CM | POA: Diagnosis not present

## 2017-03-27 DIAGNOSIS — H04123 Dry eye syndrome of bilateral lacrimal glands: Secondary | ICD-10-CM | POA: Diagnosis not present

## 2017-03-27 DIAGNOSIS — R682 Dry mouth, unspecified: Secondary | ICD-10-CM | POA: Diagnosis not present

## 2017-04-06 DIAGNOSIS — K1120 Sialoadenitis, unspecified: Secondary | ICD-10-CM | POA: Diagnosis not present

## 2017-04-06 DIAGNOSIS — R682 Dry mouth, unspecified: Secondary | ICD-10-CM | POA: Diagnosis not present

## 2017-07-10 DIAGNOSIS — Z789 Other specified health status: Secondary | ICD-10-CM | POA: Diagnosis not present

## 2017-07-10 DIAGNOSIS — I10 Essential (primary) hypertension: Secondary | ICD-10-CM | POA: Diagnosis not present

## 2017-07-10 DIAGNOSIS — W57XXXA Bitten or stung by nonvenomous insect and other nonvenomous arthropods, initial encounter: Secondary | ICD-10-CM | POA: Diagnosis not present

## 2017-07-10 DIAGNOSIS — Z2821 Immunization not carried out because of patient refusal: Secondary | ICD-10-CM | POA: Diagnosis not present

## 2017-07-10 DIAGNOSIS — S30860A Insect bite (nonvenomous) of lower back and pelvis, initial encounter: Secondary | ICD-10-CM | POA: Diagnosis not present

## 2017-07-10 DIAGNOSIS — Z6826 Body mass index (BMI) 26.0-26.9, adult: Secondary | ICD-10-CM | POA: Diagnosis not present

## 2017-07-10 DIAGNOSIS — Z299 Encounter for prophylactic measures, unspecified: Secondary | ICD-10-CM | POA: Diagnosis not present

## 2017-10-05 DIAGNOSIS — Z6824 Body mass index (BMI) 24.0-24.9, adult: Secondary | ICD-10-CM | POA: Diagnosis not present

## 2017-10-05 DIAGNOSIS — Z1339 Encounter for screening examination for other mental health and behavioral disorders: Secondary | ICD-10-CM | POA: Diagnosis not present

## 2017-10-05 DIAGNOSIS — Z1211 Encounter for screening for malignant neoplasm of colon: Secondary | ICD-10-CM | POA: Diagnosis not present

## 2017-10-05 DIAGNOSIS — I10 Essential (primary) hypertension: Secondary | ICD-10-CM | POA: Diagnosis not present

## 2017-10-05 DIAGNOSIS — R5383 Other fatigue: Secondary | ICD-10-CM | POA: Diagnosis not present

## 2017-10-05 DIAGNOSIS — E7800 Pure hypercholesterolemia, unspecified: Secondary | ICD-10-CM | POA: Diagnosis not present

## 2017-10-05 DIAGNOSIS — E039 Hypothyroidism, unspecified: Secondary | ICD-10-CM | POA: Diagnosis not present

## 2017-10-05 DIAGNOSIS — Z299 Encounter for prophylactic measures, unspecified: Secondary | ICD-10-CM | POA: Diagnosis not present

## 2017-10-05 DIAGNOSIS — Z7189 Other specified counseling: Secondary | ICD-10-CM | POA: Diagnosis not present

## 2017-10-05 DIAGNOSIS — Z0000 Encounter for general adult medical examination without abnormal findings: Secondary | ICD-10-CM | POA: Diagnosis not present

## 2017-10-05 DIAGNOSIS — Z1331 Encounter for screening for depression: Secondary | ICD-10-CM | POA: Diagnosis not present

## 2017-10-06 DIAGNOSIS — E7800 Pure hypercholesterolemia, unspecified: Secondary | ICD-10-CM | POA: Diagnosis not present

## 2017-10-06 DIAGNOSIS — R5383 Other fatigue: Secondary | ICD-10-CM | POA: Diagnosis not present

## 2017-10-06 DIAGNOSIS — Z79899 Other long term (current) drug therapy: Secondary | ICD-10-CM | POA: Diagnosis not present

## 2017-10-06 DIAGNOSIS — E039 Hypothyroidism, unspecified: Secondary | ICD-10-CM | POA: Diagnosis not present

## 2018-01-08 DIAGNOSIS — Z299 Encounter for prophylactic measures, unspecified: Secondary | ICD-10-CM | POA: Diagnosis not present

## 2018-01-08 DIAGNOSIS — I10 Essential (primary) hypertension: Secondary | ICD-10-CM | POA: Diagnosis not present

## 2018-01-08 DIAGNOSIS — Z6823 Body mass index (BMI) 23.0-23.9, adult: Secondary | ICD-10-CM | POA: Diagnosis not present

## 2018-01-08 DIAGNOSIS — E039 Hypothyroidism, unspecified: Secondary | ICD-10-CM | POA: Diagnosis not present

## 2018-01-08 DIAGNOSIS — E785 Hyperlipidemia, unspecified: Secondary | ICD-10-CM | POA: Diagnosis not present

## 2018-03-28 ENCOUNTER — Other Ambulatory Visit: Payer: Self-pay

## 2018-03-28 ENCOUNTER — Encounter (HOSPITAL_COMMUNITY): Payer: Self-pay | Admitting: Emergency Medicine

## 2018-03-28 ENCOUNTER — Emergency Department (HOSPITAL_COMMUNITY)
Admission: EM | Admit: 2018-03-28 | Discharge: 2018-03-28 | Disposition: A | Discharge status: Home / Self Care | Payer: Medicare Other | Attending: Emergency Medicine | Admitting: Emergency Medicine

## 2018-03-28 DIAGNOSIS — I10 Essential (primary) hypertension: Secondary | ICD-10-CM | POA: Insufficient documentation

## 2018-03-28 DIAGNOSIS — E039 Hypothyroidism, unspecified: Secondary | ICD-10-CM | POA: Diagnosis not present

## 2018-03-28 DIAGNOSIS — G510 Bell's palsy: Secondary | ICD-10-CM | POA: Insufficient documentation

## 2018-03-28 DIAGNOSIS — R29810 Facial weakness: Secondary | ICD-10-CM | POA: Diagnosis present

## 2018-03-28 DIAGNOSIS — Z79899 Other long term (current) drug therapy: Secondary | ICD-10-CM | POA: Insufficient documentation

## 2018-03-28 MED ORDER — ARTIFICIAL TEARS OPHTHALMIC OINT
TOPICAL_OINTMENT | Freq: Every evening | OPHTHALMIC | 0 refills | Status: AC | PRN
Start: 2018-03-28 — End: ?

## 2018-03-28 MED ORDER — PREDNISONE 50 MG PO TABS
60.0000 mg | ORAL_TABLET | Freq: Once | ORAL | Status: AC
  Administered 2018-03-28: 60 mg via ORAL
  Filled 2018-03-28: qty 1

## 2018-03-28 MED ORDER — PREDNISONE 20 MG PO TABS: ORAL_TABLET | ORAL | 0 refills | Status: DC

## 2018-03-28 NOTE — ED Notes (Signed)
Dr. Bebe ShaggyWickline assessed pt, no code stroke.

## 2018-03-28 NOTE — ED Provider Notes (Signed)
[facility]ENCY [facility] Provider Note   CSN: [id number] Arrival date & time: 03/28/18  [id number]     History   Chief Complaint Chief Complaint  Patient presents with  . Numbness    facial    HPI Madeline Middleton is a 67 y.o. female.  The history is provided by the patient and the spouse.  Weakness  Primary symptoms include focal weakness, loss of sensation. This is a new problem. The current episode started 6 to 12 hours ago. The problem has not changed since onset.There was left facial focality noted. There has been no fever. Associated symptoms include headaches. Pertinent negatives include no chest pain and no altered mental status. Associated medical issues do not include CVA.  Patient with history of hypertension and fibromyalgia presents with left facial weakness.  She reports waking up at approximately 4 AM and felt she drinking water.  She then noted left facial numbness and weakness.  There is no arm or leg weakness.  No arm or leg numbness.  She did report mild headache yesterday.  No new hearing changes.  No new visual changes.  She reports difficulty closing her left eye.   She went to bed at 2200 at her baseline.  Past Medical History:  Diagnosis Date  . Fibromyalgia   . GERD (gastroesophageal reflux disease)   . Hypertension   . Hypothyroidism     There are no active problems to display for this patient.   Past Surgical History:  Procedure Laterality Date  . CATARACT EXTRACTION W/PHACO Left 12/07/2015   Procedure: CATARACT EXTRACTION PHACO AND INTRAOCULAR LENS PLACEMENT LEFT EYE; CDE:  4.54;  Surgeon: Gemma PayorKerry Hunt, MD;  Location: [facility];  Service: Ophthalmology;  Laterality: Left;  . CATARACT EXTRACTION W/PHACO Right 12/17/2015   Procedure: CATARACT EXTRACTION PHACO AND INTRAOCULAR LENS PLACEMENT ; CDE:  5.44;  Surgeon: Gemma PayorKerry Hunt, MD;  Location: [facility];  Service: Ophthalmology;  Laterality: Right;     OB History   None      Home Medications    Prior  to Admission medications   Medication Sig Start Date End Date Taking? Authorizing Provider  amLODipine (NORVASC) 5 MG tablet Take 5 mg by mouth daily.    [provider]  BIOTIN PO Take 1 tablet by mouth daily.    [provider]  CALCIUM PO Take 1 tablet by mouth daily.    [provider]  Cholecalciferol (VITAMIN D PO) Take 1 capsule by mouth daily.    [provider]  co-enzyme Q-10 30 MG capsule Take 30 mg by mouth 2 (two) times daily.    [provider]  fluticasone (FLONASE) 50 MCG/ACT nasal spray Place 1 spray into both nostrils daily.    [provider]  Glucosamine-Chondroitin (GLUCOSAMINE CHONDR COMPLEX PO) Take 1 tablet by mouth daily.    [provider]  levothyroxine (SYNTHROID, LEVOTHROID) 25 MCG tablet Take 25 mcg by mouth daily. 09/24/15   [provider]  loratadine (CLARITIN) 10 MG tablet Take 10 mg by mouth daily.    [provider]  MAGNESIUM PO Take 1 tablet by mouth daily.    [provider]  Multiple Vitamins-Minerals (MULTIVITAMIN WITH MINERALS) tablet Take 1 tablet by mouth daily.    [provider]  Probiotic Product (PROBIOTIC PO) Take 1 capsule by mouth daily.    [provider]    Family History No family history on file.  Social History Social History   Tobacco Use  .  Smoking status: Never Smoker  . Smokeless tobacco: Never Used  Substance Use Topics  . Alcohol use: No  . Drug use: No     Allergies   Penicillins and Sulfa antibiotics   Review of Systems Review of Systems  Constitutional: Negative for fever.  Cardiovascular: Negative for chest pain.  Neurological: Positive for focal weakness, weakness, numbness and headaches. Negative for syncope.  All other systems reviewed and are negative.    Physical Exam Updated Vital Signs BP (!) 169/89 (BP Location: Left Arm)   Pulse 81   Temp 98 F (36.7 C) (Oral)   Resp 15   Ht 1.676 m (5'  6")   Wt 64.9 kg   SpO2 100%   BMI 23.08 kg/m   Physical Exam CONSTITUTIONAL: Well developed/well nourished, anxious HEAD: Normocephalic/atraumatic EYES: EOMI/PERRL, no nystagmus ENMT: Mucous membranes moist NECK: supple no meningeal signs, no bruits CV: S1/S2 noted, no murmurs/rubs/gallops noted LUNGS: Lungs are clear to auscultation bilaterally, no apparent distress ABDOMEN: soft, nontender, no rebound or guarding GU:no cva tenderness NEURO:Awake/alert,left facial droop, mild sensory deficit to left face, no arm or leg drift is noted She has difficulty with closing left eye Equal 5/5 strength with shoulder abduction Equal 5/5 strength with hip flexion,knee flex/extension, foot dorsi/plantar flexion Cranial nerves 3/4/5/10/01/08/11/12 tested and intact Pt reports she can ambulate No past pointing Sensation to light touch intact in all extremities EXTREMITIES: pulses normal, full ROM SKIN: warm, color normal PSYCH: no abnormalities of mood noted   ED Treatments / Results  Labs (all labs ordered are listed, but only abnormal results are displayed) Labs Reviewed - No data to display  EKG None  Radiology No results found.  Procedures Procedures  Medications Ordered in ED Medications  predniSONE (DELTASONE) tablet 60 mg (has no administration in time range)     Initial Impression / Assessment and Plan / ED Course  I have reviewed the triage vital signs and the nursing notes.   Patient presented with acute onset of left facial weakness.  She has obvious signs of Bell's palsy.  She has no arm or leg weakness.  The weakness is isolated to the left forehead and face.  She has no other acute neurologic complaints.  I did offer further imaging including CT head, but she declined that she feels improved.  I feel that this is reasonable.  Due to lack of evidence, will not start antivirals but will start prednisone for 1 week.  She already has PCP follow-up in about 12 days.  We  discussed at length need to protect her eye with moisturizing drops as well as eye ointment at night.  She will also use an eye patch at night.  We discussed strict ER return precautions  Final Clinical Impressions(s) / ED Diagnoses   Final diagnoses:  Bell's palsy    ED Discharge Orders         Ordered    predniSONE (DELTASONE) 20 MG tablet     03/28/18 0738    artificial tears (LACRILUBE) OINT ophthalmic ointment  At bedtime PRN     03/28/18 2956           Zadie Rhine, MD 03/28/18 [phone]

## 2018-03-28 NOTE — ED Triage Notes (Signed)
Pt c/o left sided facial numbness that started when she woke up at 0400 this morning. Pt stated when she woke up and looked in the mirror at 0600 she noticed the facial droop. No other symptoms

## 2018-04-09 DIAGNOSIS — Z6822 Body mass index (BMI) 22.0-22.9, adult: Secondary | ICD-10-CM | POA: Diagnosis not present

## 2018-04-09 DIAGNOSIS — E039 Hypothyroidism, unspecified: Secondary | ICD-10-CM | POA: Diagnosis not present

## 2018-04-09 DIAGNOSIS — Z299 Encounter for prophylactic measures, unspecified: Secondary | ICD-10-CM | POA: Diagnosis not present

## 2018-04-09 DIAGNOSIS — G510 Bell's palsy: Secondary | ICD-10-CM | POA: Diagnosis not present

## 2018-04-09 DIAGNOSIS — I10 Essential (primary) hypertension: Secondary | ICD-10-CM | POA: Diagnosis not present

## 2018-04-09 DIAGNOSIS — E785 Hyperlipidemia, unspecified: Secondary | ICD-10-CM | POA: Diagnosis not present

## 2018-04-16 DIAGNOSIS — G510 Bell's palsy: Secondary | ICD-10-CM | POA: Diagnosis not present

## 2018-04-16 DIAGNOSIS — Z299 Encounter for prophylactic measures, unspecified: Secondary | ICD-10-CM | POA: Diagnosis not present

## 2018-04-16 DIAGNOSIS — E785 Hyperlipidemia, unspecified: Secondary | ICD-10-CM | POA: Diagnosis not present

## 2018-04-16 DIAGNOSIS — E039 Hypothyroidism, unspecified: Secondary | ICD-10-CM | POA: Diagnosis not present

## 2018-04-16 DIAGNOSIS — I10 Essential (primary) hypertension: Secondary | ICD-10-CM | POA: Diagnosis not present

## 2018-04-16 DIAGNOSIS — Z6822 Body mass index (BMI) 22.0-22.9, adult: Secondary | ICD-10-CM | POA: Diagnosis not present

## 2018-05-02 ENCOUNTER — Encounter: Payer: Self-pay | Admitting: Neurology

## 2018-05-02 ENCOUNTER — Ambulatory Visit (INDEPENDENT_AMBULATORY_CARE_PROVIDER_SITE_OTHER): Payer: Medicare Other | Admitting: Neurology

## 2018-05-02 VITALS — BP 147/83 | HR 86 | Ht 66.0 in | Wt 139.0 lb

## 2018-05-02 DIAGNOSIS — R739 Hyperglycemia, unspecified: Secondary | ICD-10-CM

## 2018-05-02 DIAGNOSIS — G510 Bell's palsy: Secondary | ICD-10-CM

## 2018-05-02 HISTORY — DX: Bell's palsy: G51.0

## 2018-05-02 NOTE — Progress Notes (Signed)
Reason for visit: Left Bell's palsy  Referring physician: Dr. Orvan JulyVyas  Alzora L Weidinger is a 68 y.o. female  History of present illness:  Ms. Gregor HamsMoulden is a 68 year old right-handed white female with a history of fibromyalgia and hypertension.  The patient was seen through the emergency room on 28 March 2018 with sudden onset of left facial weakness.  The patient had awakened early that morning and tried to take a sip of water through a straw and then was unable to do this.  She went back to sleep, and when she woke up the next morning she had noted severe left facial weakness.  The patient reported no discomfort whatsoever associated with this, she has not noted any change in taste sensation or any hyperacusis.  She is not able to protect the left eye well, bright lights will bother her.  She is not able to operate a motor vehicle as she is using Lacri-Lube in the left eye, the left eye is her distance eye and the right eye is for near objects.  The patient reports no numbness or weakness of the arms or legs.  She denies any significant changes in balance or difficulty controlling the bowels or the bladder.  She denies any neck pain.  She has had some problems with eating as the liquids will come out of the mouth, she may pocket food on the left cheek.  She denies any double vision.  She is sent to this office for an evaluation.  No blood work or CT head scan evaluations have been done.  Her medical records indicate a history of hyperglycemia, but the patient is not aware of this diagnosis.  Past Medical History:  Diagnosis Date  . Fibromyalgia   . GERD (gastroesophageal reflux disease)   . Hypertension   . Hypothyroidism     Past Surgical History:  Procedure Laterality Date  . CATARACT EXTRACTION W/PHACO Left 12/07/2015   Procedure: CATARACT EXTRACTION PHACO AND INTRAOCULAR LENS PLACEMENT LEFT EYE; CDE:  4.54;  Surgeon: Gemma PayorKerry Hunt, MD;  Location: [facility];  Service: Ophthalmology;  Laterality:  Left;  . CATARACT EXTRACTION W/PHACO Right 12/17/2015   Procedure: CATARACT EXTRACTION PHACO AND INTRAOCULAR LENS PLACEMENT ; CDE:  5.44;  Surgeon: Gemma PayorKerry Hunt, MD;  Location: [facility];  Service: Ophthalmology;  Laterality: Right;    Family History  Problem Relation Age of Onset  . Breast cancer Mother   . Anuerysm Father     Social history:  reports that she has never smoked. She has never used smokeless tobacco. She reports that she does not drink alcohol or use drugs.  Medications:  Prior to Admission medications   Medication Sig Start Date End Date Taking? Authorizing Provider  amLODipine (NORVASC) 5 MG tablet Take 5 mg by mouth daily.   Yes [provider]  artificial tears (LACRILUBE) OINT ophthalmic ointment Place into the left eye at bedtime as needed for dry eyes. 03/28/18  Yes Zadie RhineWickline, Donald, MD  BIOTIN PO Take 1 tablet by mouth daily.   Yes [provider]  CALCIUM PO Take 1 tablet by mouth daily.   Yes [provider]  Cholecalciferol (VITAMIN D PO) Take 1 capsule by mouth daily.   Yes [provider]  co-enzyme Q-10 30 MG capsule Take 30 mg by mouth 2 (two) times daily.   Yes [provider]  fluticasone (FLONASE) 50 MCG/ACT nasal spray Place 1 spray into both nostrils daily.   Yes [provider]  Glucosamine-Chondroitin (  GLUCOSAMINE CHONDR COMPLEX PO) Take 1 tablet by mouth daily.   Yes [provider]  levothyroxine (SYNTHROID, LEVOTHROID) 25 MCG tablet Take 25 mcg by mouth daily. 09/24/15  Yes [provider]  loratadine (CLARITIN) 10 MG tablet Take 10 mg by mouth daily.   Yes [provider]  MAGNESIUM PO Take 1 tablet by mouth daily.   Yes [provider]  Multiple Vitamins-Minerals (MULTIVITAMIN WITH MINERALS) tablet Take 1 tablet by mouth daily.   Yes [provider]  predniSONE (DELTASONE) 20 MG tablet Take 3 tablets PO daily for 6 days 03/28/18  Yes Zadie Rhine, MD    Probiotic Product (PROBIOTIC PO) Take 1 capsule by mouth daily.   Yes [provider]      Allergies  Allergen Reactions  . Penicillins Hives  . Sulfa Antibiotics     ROS:  Out of a complete 14 system review of symptoms, the patient complains only of the following symptoms, and all other reviewed systems are negative.  Ringing in the ears Eye pain Headache, numbness, restless legs  Blood pressure (!) 147/83, pulse 86, height 5\' 6"  (1.676 m), weight 139 lb (63 kg).  Physical Exam  General: The patient is alert and cooperative at the time of the examination.  Eyes: Pupils are equal, round, and reactive to light. Discs are flat bilaterally.   Ears: The tympanic membrane was partially obscured by cerumen on the left, no obvious inflammation seen, unable to see the tympanic membrane on the right secondary to cerumen.  Neck: The neck is supple, no carotid bruits are noted.  Respiratory: The respiratory examination is clear.  Cardiovascular: The cardiovascular examination reveals a regular rate and rhythm, no obvious murmurs or rubs are noted.  Skin: Extremities are without significant edema.  Neurologic Exam  Mental status: The patient is alert and oriented x 3 at the time of the examination. The patient has apparent normal recent and remote memory, with an apparently normal attention span and concentration ability.  Cranial nerves: Facial symmetry is not present. There is good sensation of the face to pinprick and soft touch bilaterally. The strength of the muscles to head turning and shoulder shrug are normal bilaterally.  The patient has severe peripheral left facial weakness, inability to fully close the left eye.  Speech is well enunciated, no aphasia or dysarthria is noted.  The patient does have some difficulty forming labial sounds.  Extraocular movements are full. Visual fields are full. The tongue is midline, and the patient has symmetric elevation of the soft  palate. No obvious hearing deficits are noted.  Motor: The motor testing reveals 5 over 5 strength of all 4 extremities. Good symmetric motor tone is noted throughout.  Sensory: Sensory testing is intact to pinprick, soft touch, vibration sensation, and position sense on all 4 extremities. No evidence of extinction is noted.  Coordination: Cerebellar testing reveals good finger-nose-finger and heel-to-shin bilaterally.  Gait and station: Gait is normal. Tandem gait is normal. Romberg is negative. No drift is seen.  Reflexes: Deep tendon reflexes are symmetric and normal bilaterally. Toes are downgoing bilaterally.   Assessment/Plan:  1.  Left Bell's palsy  The patient appears to have a fairly complete left Bell's palsy.  Surprisingly, she never experienced any head pain, she does not have any taste alteration.  The patient has been given a course of prednisone and Valtrex.  She will be sent for further blood work today.  The patient likely will take 3, 4, or  5 months to begin improving, and it is possible she may have incomplete regeneration of the nerve.  The patient is protecting the left eye.  She will follow-up here in about 3 months.  Marlan Palau. Keith Alieu Finnigan MD 05/02/2018 7:33 AM  [facility] [address] [place] [id number]  Phone [phone] Fax [phone]

## 2018-05-03 ENCOUNTER — Telehealth: Payer: Self-pay

## 2018-05-03 LAB — COMPREHENSIVE METABOLIC PANEL
A/G RATIO: 2 (ref 1.2–2.2)
ALK PHOS: 120 IU/L — AB (ref 39–117)
ALT: 17 IU/L (ref 0–32)
AST: 18 IU/L (ref 0–40)
Albumin: 4.3 g/dL (ref 3.6–4.8)
BILIRUBIN TOTAL: 0.9 mg/dL (ref 0.0–1.2)
BUN/Creatinine Ratio: 22 (ref 12–28)
BUN: 16 mg/dL (ref 8–27)
CALCIUM: 9.8 mg/dL (ref 8.7–10.3)
CHLORIDE: 103 mmol/L (ref 96–106)
CO2: 22 mmol/L (ref 20–29)
Creatinine, Ser: 0.74 mg/dL (ref 0.57–1.00)
GFR calc Af Amer: 96 mL/min/{1.73_m2} (ref >59)
GFR, EST NON AFRICAN AMERICAN: 84 mL/min/{1.73_m2} (ref >59)
Globulin, Total: 2.2 g/dL (ref 1.5–4.5)
Glucose: 88 mg/dL (ref 65–99)
POTASSIUM: 3.5 mmol/L (ref 3.5–5.2)
SODIUM: 142 mmol/L (ref 134–144)
Total Protein: 6.5 g/dL (ref 6.0–8.5)

## 2018-05-03 LAB — HEMOGLOBIN A1C
ESTIMATED AVERAGE GLUCOSE: 120 mg/dL
HEMOGLOBIN A1C: 5.8 % — AB (ref 4.8–5.6)

## 2018-05-03 LAB — ANA W/REFLEX: ANA: NEGATIVE

## 2018-05-03 LAB — B. BURGDORFI ANTIBODIES: Lyme IgG/IgM Ab: <0.91 {ISR} (ref 0.00–0.90)

## 2018-05-03 LAB — SEDIMENTATION RATE: Sed Rate: 6 mm/hr (ref 0–40)

## 2018-05-03 LAB — ANGIOTENSIN CONVERTING ENZYME: ANGIO CONVERT ENZYME: 36 U/L (ref 14–82)

## 2018-05-03 NOTE — Telephone Encounter (Signed)
I contacted the pt and was able to review unremarkable labs with the exception of the slightly elevated Hemoglobin A1c result 5.8% and slightly elevated Alkaline Phosphate. Pt verbalized understanding. Pt inquired about A1c, if this result was something to be worried about? I reassured the pt her level was slightly elevated but diet changes along with exercising (ex. Walking) would aid in lowering the A1c.   Pt was agreeable to this information and had no further questions/concerns.

## 2018-05-03 NOTE — Telephone Encounter (Signed)
-----   Message from York Spaniel, MD sent at 05/03/2018  5:13 PM EST ----- Blood work is unremarkable exception of a slight elevation in hemoglobin A1c consistent with borderline diabetes.  There is a minimal elevation of alkaline phosphatase level, not clinically significant.  Please call the patient. ----- Message ----- From: Nell Range Lab Results In Sent: 05/03/2018   7:38 AM EST To: York Spaniel, MD

## 2018-05-14 DIAGNOSIS — M47812 Spondylosis without myelopathy or radiculopathy, cervical region: Secondary | ICD-10-CM | POA: Diagnosis not present

## 2018-05-14 DIAGNOSIS — M9901 Segmental and somatic dysfunction of cervical region: Secondary | ICD-10-CM | POA: Diagnosis not present

## 2018-05-14 DIAGNOSIS — G510 Bell's palsy: Secondary | ICD-10-CM | POA: Diagnosis not present

## 2018-05-16 DIAGNOSIS — M9901 Segmental and somatic dysfunction of cervical region: Secondary | ICD-10-CM | POA: Diagnosis not present

## 2018-05-16 DIAGNOSIS — M47812 Spondylosis without myelopathy or radiculopathy, cervical region: Secondary | ICD-10-CM | POA: Diagnosis not present

## 2018-05-16 DIAGNOSIS — G510 Bell's palsy: Secondary | ICD-10-CM | POA: Diagnosis not present

## 2018-05-21 DIAGNOSIS — M9901 Segmental and somatic dysfunction of cervical region: Secondary | ICD-10-CM | POA: Diagnosis not present

## 2018-05-21 DIAGNOSIS — M47812 Spondylosis without myelopathy or radiculopathy, cervical region: Secondary | ICD-10-CM | POA: Diagnosis not present

## 2018-05-21 DIAGNOSIS — G510 Bell's palsy: Secondary | ICD-10-CM | POA: Diagnosis not present

## 2018-05-23 DIAGNOSIS — M9901 Segmental and somatic dysfunction of cervical region: Secondary | ICD-10-CM | POA: Diagnosis not present

## 2018-05-23 DIAGNOSIS — G510 Bell's palsy: Secondary | ICD-10-CM | POA: Diagnosis not present

## 2018-05-23 DIAGNOSIS — M47812 Spondylosis without myelopathy or radiculopathy, cervical region: Secondary | ICD-10-CM | POA: Diagnosis not present

## 2018-05-28 DIAGNOSIS — M47812 Spondylosis without myelopathy or radiculopathy, cervical region: Secondary | ICD-10-CM | POA: Diagnosis not present

## 2018-05-28 DIAGNOSIS — G510 Bell's palsy: Secondary | ICD-10-CM | POA: Diagnosis not present

## 2018-05-28 DIAGNOSIS — M9901 Segmental and somatic dysfunction of cervical region: Secondary | ICD-10-CM | POA: Diagnosis not present

## 2018-06-04 DIAGNOSIS — M47812 Spondylosis without myelopathy or radiculopathy, cervical region: Secondary | ICD-10-CM | POA: Diagnosis not present

## 2018-06-04 DIAGNOSIS — G510 Bell's palsy: Secondary | ICD-10-CM | POA: Diagnosis not present

## 2018-06-04 DIAGNOSIS — M9901 Segmental and somatic dysfunction of cervical region: Secondary | ICD-10-CM | POA: Diagnosis not present

## 2018-06-14 DIAGNOSIS — M47812 Spondylosis without myelopathy or radiculopathy, cervical region: Secondary | ICD-10-CM | POA: Diagnosis not present

## 2018-06-14 DIAGNOSIS — M9901 Segmental and somatic dysfunction of cervical region: Secondary | ICD-10-CM | POA: Diagnosis not present

## 2018-06-14 DIAGNOSIS — G510 Bell's palsy: Secondary | ICD-10-CM | POA: Diagnosis not present

## 2018-06-21 DIAGNOSIS — M47812 Spondylosis without myelopathy or radiculopathy, cervical region: Secondary | ICD-10-CM | POA: Diagnosis not present

## 2018-06-21 DIAGNOSIS — G510 Bell's palsy: Secondary | ICD-10-CM | POA: Diagnosis not present

## 2018-06-21 DIAGNOSIS — M9901 Segmental and somatic dysfunction of cervical region: Secondary | ICD-10-CM | POA: Diagnosis not present

## 2018-07-05 DIAGNOSIS — M47812 Spondylosis without myelopathy or radiculopathy, cervical region: Secondary | ICD-10-CM | POA: Diagnosis not present

## 2018-07-05 DIAGNOSIS — M9901 Segmental and somatic dysfunction of cervical region: Secondary | ICD-10-CM | POA: Diagnosis not present

## 2018-07-05 DIAGNOSIS — G510 Bell's palsy: Secondary | ICD-10-CM | POA: Diagnosis not present

## 2018-07-10 DIAGNOSIS — Z299 Encounter for prophylactic measures, unspecified: Secondary | ICD-10-CM | POA: Diagnosis not present

## 2018-07-10 DIAGNOSIS — Z6821 Body mass index (BMI) 21.0-21.9, adult: Secondary | ICD-10-CM | POA: Diagnosis not present

## 2018-07-10 DIAGNOSIS — E039 Hypothyroidism, unspecified: Secondary | ICD-10-CM | POA: Diagnosis not present

## 2018-07-10 DIAGNOSIS — I10 Essential (primary) hypertension: Secondary | ICD-10-CM | POA: Diagnosis not present

## 2018-07-10 DIAGNOSIS — R42 Dizziness and giddiness: Secondary | ICD-10-CM | POA: Diagnosis not present

## 2018-07-10 DIAGNOSIS — Z789 Other specified health status: Secondary | ICD-10-CM | POA: Diagnosis not present

## 2018-07-10 DIAGNOSIS — E785 Hyperlipidemia, unspecified: Secondary | ICD-10-CM | POA: Diagnosis not present

## 2018-07-19 DIAGNOSIS — M47812 Spondylosis without myelopathy or radiculopathy, cervical region: Secondary | ICD-10-CM | POA: Diagnosis not present

## 2018-07-19 DIAGNOSIS — G510 Bell's palsy: Secondary | ICD-10-CM | POA: Diagnosis not present

## 2018-07-19 DIAGNOSIS — M9901 Segmental and somatic dysfunction of cervical region: Secondary | ICD-10-CM | POA: Diagnosis not present

## 2018-07-31 ENCOUNTER — Encounter: Payer: Self-pay | Admitting: Neurology

## 2018-07-31 ENCOUNTER — Ambulatory Visit (INDEPENDENT_AMBULATORY_CARE_PROVIDER_SITE_OTHER): Payer: Medicare Other | Admitting: Neurology

## 2018-07-31 ENCOUNTER — Other Ambulatory Visit: Payer: Self-pay

## 2018-07-31 DIAGNOSIS — G510 Bell's palsy: Secondary | ICD-10-CM

## 2018-07-31 NOTE — Progress Notes (Signed)
I have read the note, and I agree with the clinical assessment and plan.  Catheryn Slifer K Jayce Boyko   

## 2018-07-31 NOTE — Progress Notes (Signed)
Virtual Visit via Video Note  I connected with Madeline Middleton on 07/31/18 at  8:45 AM EDT by a video enabled telemedicine application and verified that I am speaking with the correct person using two identifiers.   I discussed the limitations of evaluation and management by telemedicine and the availability of in person appointments. The patient expressed understanding and agreed to proceed.  History of Present Illness: 07/31/2018 SS: Madeline Middleton is a 68 year old female who presents for follow-up for left-sided Bell's palsy that occurred in December 2019.  She presented to the emergency department with sudden onset of left sided facial weakness.  She was treated with a course of prednisone and Valtrex.  She had lab work evaluation in January 2020 (sed rate, ANA, b. burgdorfi antibodies, ACE, A1C, CMP).  All were unremarkable with the exception of A1c elevated at 5.8.   I talked with Madeline Middleton via via virtual visit.  She reports about a 40% improvement in her left-sided facial weakness.  She is now able to somewhat close her left eye, her left sided mouth droop has improved.  It continues to be an effort to chew her food, after eating she gets tired.  Sometimes she will pocket food on the left side by mouth. She does report an alteration in her taste sensation, is difficult to taste salty foods.  She continues to protect her left eye.  She is using drops and ointment.  Sometimes her left eye waters.  Occasionally she will feel like her left upper lip is swollen.  She denies any facial pain or headache.  She denies any numbness or weakness to her arms or legs.  She denies any falls.  She reports her primary care doctor recently started her on Cymbalta for some minor depression.  Otherwise she denies any changes to her medical history or medications.  05/02/2018 Dr. Anne HahnWillis: Madeline Middleton is a 68 year old right-handed white female with a history of fibromyalgia and hypertension. The patient was seen through the  emergency room on 28 March 2018 with sudden onset of left facial weakness.  The patient had awakened early that morning and tried to take a sip of water through a straw and then was unable to do this.  She went back to sleep, and when she woke up the next morning she had noted severe left facial weakness.  The patient reported no discomfort whatsoever associated with this, she has not noted any change in taste sensation or any hyperacusis.  She is not able to protect the left eye well, bright lights will bother her.  She is not able to operate a motor vehicle as she is using Lacri-Lube in the left eye, the left eye is her distance eye and the right eye is for near objects.  The patient reports no numbness or weakness of the arms or legs.  She denies any significant changes in balance or difficulty controlling the bowels or the bladder.  She denies any neck pain.  She has had some problems with eating as the liquids will come out of the mouth, she may pocket food on the left cheek.  She denies any double vision.  She is sent to this office for an evaluation.  No blood work or CT head scan evaluations have been done.  Her medical records indicate a history of hyperglycemia, but the patient is not aware of this diagnosis. Observations/Objective: I reviewed her medications and medical history  Alert, appropriate verbal response, left-sided facial asymmetry noted, unable to raise  left eyebrow, able to partially close left eye, weakness to left side when puffing cheeks, no impairment of extraocular movements, left-sided asymmetry to mouth when smiling, symmetric shoulder shrug, no arm drift, gait is intact  Assessment and Plan: 1.  Left-sided Bell's palsy  Overall the patient thinks she may be 40% improved in her symptoms.  She thinks her weakness to her mouth has improved and she now has some ability to close her left eye.  She does report some taste alteration, not able to discern salty taste.  She continues  to protect her left eye.  We discussed that it is encouraging that she has noticed some improvement.  It is likely this will take some time, may not fully improve.  We discussed possible MRI, deferred after discussion due to patient improvement.  She will continue to monitor her A1c, exercise, healthy eating.  She will follow-up in 4 to 6 months for revisit in the office.  Follow Up Instructions:  4-6 months    I discussed the assessment and treatment plan with the patient. The patient was provided an opportunity to ask questions and all were answered. The patient agreed with the plan and demonstrated an understanding of the instructions.   The patient was advised to call back or seek an in-person evaluation if the symptoms worsen or if the condition fails to improve as anticipated.  I provided 20 minutes of non-face-to-face time during this encounter.   Otila Kluver, DNP  [facility] [address], [address] [place], [place] [address] [phone]

## 2018-08-01 ENCOUNTER — Ambulatory Visit: Payer: Medicare Other | Admitting: Neurology

## 2018-08-02 DIAGNOSIS — M47812 Spondylosis without myelopathy or radiculopathy, cervical region: Secondary | ICD-10-CM | POA: Diagnosis not present

## 2018-08-02 DIAGNOSIS — M9901 Segmental and somatic dysfunction of cervical region: Secondary | ICD-10-CM | POA: Diagnosis not present

## 2018-08-02 DIAGNOSIS — G510 Bell's palsy: Secondary | ICD-10-CM | POA: Diagnosis not present

## 2018-08-16 DIAGNOSIS — M9901 Segmental and somatic dysfunction of cervical region: Secondary | ICD-10-CM | POA: Diagnosis not present

## 2018-08-16 DIAGNOSIS — M47812 Spondylosis without myelopathy or radiculopathy, cervical region: Secondary | ICD-10-CM | POA: Diagnosis not present

## 2018-08-16 DIAGNOSIS — G510 Bell's palsy: Secondary | ICD-10-CM | POA: Diagnosis not present

## 2018-08-30 DIAGNOSIS — M9901 Segmental and somatic dysfunction of cervical region: Secondary | ICD-10-CM | POA: Diagnosis not present

## 2018-08-30 DIAGNOSIS — M47812 Spondylosis without myelopathy or radiculopathy, cervical region: Secondary | ICD-10-CM | POA: Diagnosis not present

## 2018-08-30 DIAGNOSIS — G510 Bell's palsy: Secondary | ICD-10-CM | POA: Diagnosis not present

## 2018-09-13 DIAGNOSIS — G510 Bell's palsy: Secondary | ICD-10-CM | POA: Diagnosis not present

## 2018-09-13 DIAGNOSIS — M47812 Spondylosis without myelopathy or radiculopathy, cervical region: Secondary | ICD-10-CM | POA: Diagnosis not present

## 2018-09-13 DIAGNOSIS — M9901 Segmental and somatic dysfunction of cervical region: Secondary | ICD-10-CM | POA: Diagnosis not present

## 2018-09-27 DIAGNOSIS — M47812 Spondylosis without myelopathy or radiculopathy, cervical region: Secondary | ICD-10-CM | POA: Diagnosis not present

## 2018-09-27 DIAGNOSIS — M9901 Segmental and somatic dysfunction of cervical region: Secondary | ICD-10-CM | POA: Diagnosis not present

## 2018-09-27 DIAGNOSIS — G510 Bell's palsy: Secondary | ICD-10-CM | POA: Diagnosis not present

## 2018-10-10 DIAGNOSIS — Z1339 Encounter for screening examination for other mental health and behavioral disorders: Secondary | ICD-10-CM | POA: Diagnosis not present

## 2018-10-10 DIAGNOSIS — Z1211 Encounter for screening for malignant neoplasm of colon: Secondary | ICD-10-CM | POA: Diagnosis not present

## 2018-10-10 DIAGNOSIS — E7800 Pure hypercholesterolemia, unspecified: Secondary | ICD-10-CM | POA: Diagnosis not present

## 2018-10-10 DIAGNOSIS — Z299 Encounter for prophylactic measures, unspecified: Secondary | ICD-10-CM | POA: Diagnosis not present

## 2018-10-10 DIAGNOSIS — Z01419 Encounter for gynecological examination (general) (routine) without abnormal findings: Secondary | ICD-10-CM | POA: Diagnosis not present

## 2018-10-10 DIAGNOSIS — Z6821 Body mass index (BMI) 21.0-21.9, adult: Secondary | ICD-10-CM | POA: Diagnosis not present

## 2018-10-10 DIAGNOSIS — R5383 Other fatigue: Secondary | ICD-10-CM | POA: Diagnosis not present

## 2018-10-10 DIAGNOSIS — E039 Hypothyroidism, unspecified: Secondary | ICD-10-CM | POA: Diagnosis not present

## 2018-10-10 DIAGNOSIS — I10 Essential (primary) hypertension: Secondary | ICD-10-CM | POA: Diagnosis not present

## 2018-10-10 DIAGNOSIS — Z1331 Encounter for screening for depression: Secondary | ICD-10-CM | POA: Diagnosis not present

## 2018-10-10 DIAGNOSIS — Z7189 Other specified counseling: Secondary | ICD-10-CM | POA: Diagnosis not present

## 2018-10-10 DIAGNOSIS — Z79899 Other long term (current) drug therapy: Secondary | ICD-10-CM | POA: Diagnosis not present

## 2018-10-10 DIAGNOSIS — Z0000 Encounter for general adult medical examination without abnormal findings: Secondary | ICD-10-CM | POA: Diagnosis not present

## 2018-10-11 DIAGNOSIS — M47812 Spondylosis without myelopathy or radiculopathy, cervical region: Secondary | ICD-10-CM | POA: Diagnosis not present

## 2018-10-11 DIAGNOSIS — G510 Bell's palsy: Secondary | ICD-10-CM | POA: Diagnosis not present

## 2018-10-11 DIAGNOSIS — M9901 Segmental and somatic dysfunction of cervical region: Secondary | ICD-10-CM | POA: Diagnosis not present

## 2018-10-25 DIAGNOSIS — G510 Bell's palsy: Secondary | ICD-10-CM | POA: Diagnosis not present

## 2018-10-25 DIAGNOSIS — M47812 Spondylosis without myelopathy or radiculopathy, cervical region: Secondary | ICD-10-CM | POA: Diagnosis not present

## 2018-10-25 DIAGNOSIS — M9901 Segmental and somatic dysfunction of cervical region: Secondary | ICD-10-CM | POA: Diagnosis not present

## 2018-11-08 DIAGNOSIS — G510 Bell's palsy: Secondary | ICD-10-CM | POA: Diagnosis not present

## 2018-11-08 DIAGNOSIS — M9901 Segmental and somatic dysfunction of cervical region: Secondary | ICD-10-CM | POA: Diagnosis not present

## 2018-11-08 DIAGNOSIS — M47812 Spondylosis without myelopathy or radiculopathy, cervical region: Secondary | ICD-10-CM | POA: Diagnosis not present

## 2018-11-21 DIAGNOSIS — I10 Essential (primary) hypertension: Secondary | ICD-10-CM | POA: Diagnosis not present

## 2018-11-21 DIAGNOSIS — Z6821 Body mass index (BMI) 21.0-21.9, adult: Secondary | ICD-10-CM | POA: Diagnosis not present

## 2018-11-21 DIAGNOSIS — Z299 Encounter for prophylactic measures, unspecified: Secondary | ICD-10-CM | POA: Diagnosis not present

## 2018-11-21 DIAGNOSIS — L905 Scar conditions and fibrosis of skin: Secondary | ICD-10-CM | POA: Diagnosis not present

## 2018-11-21 DIAGNOSIS — Z713 Dietary counseling and surveillance: Secondary | ICD-10-CM | POA: Diagnosis not present

## 2018-11-22 DIAGNOSIS — M9901 Segmental and somatic dysfunction of cervical region: Secondary | ICD-10-CM | POA: Diagnosis not present

## 2018-11-22 DIAGNOSIS — G510 Bell's palsy: Secondary | ICD-10-CM | POA: Diagnosis not present

## 2018-11-22 DIAGNOSIS — M47812 Spondylosis without myelopathy or radiculopathy, cervical region: Secondary | ICD-10-CM | POA: Diagnosis not present

## 2018-12-06 DIAGNOSIS — M47812 Spondylosis without myelopathy or radiculopathy, cervical region: Secondary | ICD-10-CM | POA: Diagnosis not present

## 2018-12-06 DIAGNOSIS — G510 Bell's palsy: Secondary | ICD-10-CM | POA: Diagnosis not present

## 2018-12-06 DIAGNOSIS — M9901 Segmental and somatic dysfunction of cervical region: Secondary | ICD-10-CM | POA: Diagnosis not present

## 2018-12-20 ENCOUNTER — Other Ambulatory Visit (HOSPITAL_COMMUNITY): Payer: Self-pay | Admitting: Internal Medicine

## 2018-12-20 DIAGNOSIS — M9901 Segmental and somatic dysfunction of cervical region: Secondary | ICD-10-CM | POA: Diagnosis not present

## 2018-12-20 DIAGNOSIS — M47812 Spondylosis without myelopathy or radiculopathy, cervical region: Secondary | ICD-10-CM | POA: Diagnosis not present

## 2018-12-20 DIAGNOSIS — Z1231 Encounter for screening mammogram for malignant neoplasm of breast: Secondary | ICD-10-CM

## 2018-12-20 DIAGNOSIS — G510 Bell's palsy: Secondary | ICD-10-CM | POA: Diagnosis not present

## 2018-12-31 NOTE — Progress Notes (Addendum)
PATIENT: Madeline Middleton DOB: December 20, 1950  REASON FOR VISIT: follow up HISTORY FROM: patient  HISTORY OF PRESENT ILLNESS: Today 01/01/19  HISTORY 01/01/2019 SS: Madeline Middleton is a 68 year old female with history of left-sided Bell's palsy that occurred in December 2019.  She was treated with a course of prednisone and Valtrex at that time.  She had laboratory evaluation that was unremarkable, with the exception of an elevated A1c at 5.8.  Today, she reports a 75% improvement in her symptoms.  She continues to complain of some numbness to her left lip, drooling.  She is able to completely close her left eye, but is not able to completely raise her left eyebrow.  Her trouble with swallowing has improved.  She did not feel that her taste was affected, but now she is noticing that things are tasting quite well.  She thinks she is continuing to improve, she will sometimes feel tingling to her left lip which is improvement from numbness sensation.  She has noticed that occasionally her left hand may shake if she grips something.  She does have some slurred speech, has difficulty with "P" and "F" words.  When Bell's palsy occurred in December 2019, she says she has been under significant stress as her husband was ill for several months.  She presents today for follow-up accompanied by her husband.  07/31/2018 SS: Madeline Middleton is a 68 year old female who presents for follow-up for left-sided Bell's palsy that occurred in December 2019.  She presented to the emergency department with sudden onset of left sided facial weakness.  She was treated with a course of prednisone and Valtrex.  She had lab work evaluation in January 2020 (sed rate, ANA, b. burgdorfi antibodies, ACE, A1C, CMP).  All were unremarkable with the exception of A1c elevated at 5.8.   I talked with Madeline Middleton via via virtual visit.  She reports about a 40% improvement in her left-sided facial weakness.  She is now able to somewhat close her left eye,  her left sided mouth droop has improved.  It continues to be an effort to chew her food, after eating she gets tired.  Sometimes she will pocket food on the left side by mouth. She does report an alteration in her taste sensation, is difficult to taste salty foods.  She continues to protect her left eye.  She is using drops and ointment.  Sometimes her left eye waters.  Occasionally she will feel like her left upper lip is swollen.  She denies any facial pain or headache.  She denies any numbness or weakness to her arms or legs.  She denies any falls.  She reports her primary care doctor recently started her on Cymbalta for some minor depression.  Otherwise she denies any changes to her medical history or medications.  05/02/2018 Dr. Jannifer Franklin: [name] a 68 year old right-handed white female with a history of fibromyalgia and hypertension. The patient was seen through theemergency room on 28 March 2018 with sudden onset of left facial weakness. The patient had awakened early that morning and tried to take a sip of water through a straw and then was unable to do this. She went back to sleep, and when she woke up the next morning she had noted severe left facial weakness. The patient reported no discomfort whatsoever associated with this, she has not noted any change in taste sensation or any hyperacusis. She is not able to protect the left eye well, bright lights will bother her. She is not  able to operate a motor vehicle as she is using Lacri-Lube in the left eye, the left eye is her distance eye and the right eye is for near objects. The patient reports no numbnessor weakness of the arms or legs. She denies any significant changes in balance or difficulty controlling the bowels or the bladder. She denies any neck pain. She has had some problems with eating as the liquids will come out of the mouth, she may pocket food on the left cheek. She denies any double vision. She is sent to this office  for an evaluation. No blood work or CT headscan evaluations have been done. Her medical records indicate a history of hyperglycemia, but the patient is not aware of this diagnosis.  REVIEW OF SYSTEMS: Out of a complete 14 system review of symptoms, the patient complains only of the following symptoms, and all other reviewed systems are negative.  Slurred speech, numbness  ALLERGIES: Allergies  Allergen Reactions   Penicillins Hives   Sulfa Antibiotics     HOME MEDICATIONS: Outpatient Medications Prior to Visit  Medication Sig Dispense Refill   amLODipine (NORVASC) 5 MG tablet Take 5 mg by mouth daily.     artificial tears (LACRILUBE) OINT ophthalmic ointment Place into the left eye at bedtime as needed for dry eyes. 1 Tube 0   BIOTIN PO Take 1 tablet by mouth daily.     CALCIUM PO Take 1 tablet by mouth daily.     Cholecalciferol (VITAMIN D PO) Take 1 capsule by mouth daily.     co-enzyme Q-10 30 MG capsule Take 30 mg by mouth 2 (two) times daily.     Glucosamine-Chondroitin (GLUCOSAMINE CHONDR COMPLEX PO) Take 1 tablet by mouth daily.     levothyroxine (SYNTHROID, LEVOTHROID) 25 MCG tablet Take 25 mcg by mouth daily.  4   loratadine (CLARITIN) 10 MG tablet Take 10 mg by mouth daily.     MAGNESIUM PO Take 1 tablet by mouth daily.     Multiple Vitamins-Minerals (MULTIVITAMIN WITH MINERALS) tablet Take 1 tablet by mouth daily.     Probiotic Product (PROBIOTIC PO) Take 1 capsule by mouth daily.     fluticasone (FLONASE) 50 MCG/ACT nasal spray Place 1 spray into both nostrils daily.     No facility-administered medications prior to visit.     PAST MEDICAL HISTORY: Past Medical History:  Diagnosis Date   Fibromyalgia    GERD (gastroesophageal reflux disease)    Hypertension    Hypothyroidism    Left-sided Bell's palsy 05/02/2018    PAST SURGICAL HISTORY: Past Surgical History:  Procedure Laterality Date   CATARACT EXTRACTION W/PHACO Left 12/07/2015    Procedure: CATARACT EXTRACTION PHACO AND INTRAOCULAR LENS PLACEMENT LEFT EYE; CDE:  4.54;  Surgeon: Gemma PayorKerry Hunt, MD;  Location: [facility];  Service: Ophthalmology;  Laterality: Left;   CATARACT EXTRACTION W/PHACO Right 12/17/2015   Procedure: CATARACT EXTRACTION PHACO AND INTRAOCULAR LENS PLACEMENT ; CDE:  5.44;  Surgeon: Gemma PayorKerry Hunt, MD;  Location: [facility];  Service: Ophthalmology;  Laterality: Right;    FAMILY HISTORY: Family History  Problem Relation Age of Onset   Breast cancer Mother    Anuerysm Father     SOCIAL HISTORY: Social History   Socioeconomic History   Marital status: Married    Spouse name: Madeline MuttersRoy   Number of children: 3   Years of education: Not on file   Highest education level: Associate degree: academic program  Occupational History   Not on file  Social  Needs   Financial resource strain: Not on file   Food insecurity    Worry: Not on file    Inability: Not on file   Transportation needs    Medical: Not on file    Non-medical: Not on file  Tobacco Use   Smoking status: Never Smoker   Smokeless tobacco: Never Used  Substance and Sexual Activity   Alcohol use: No   Drug use: No   Sexual activity: Never    Birth control/protection: Post-menopausal  Lifestyle   Physical activity    Days per week: Not on file    Minutes per session: Not on file   Stress: Not on file  Relationships   Social connections    Talks on phone: Not on file    Gets together: Not on file    Attends religious service: Not on file    Active member of club or organization: Not on file    Attends meetings of clubs or organizations: Not on file    Relationship status: Not on file   Intimate partner violence    Fear of current or ex partner: Not on file    Emotionally abused: Not on file    Physically abused: Not on file    Forced sexual activity: Not on file  Other Topics Concern   Not on file  Social History Narrative   Right handed    Caffeine- occasional    Lives with husband  Madeline Middleton      PHYSICAL EXAM  Vitals:   01/01/19 0909  BP: 124/72  Pulse: 78  Temp: (!) 97.5 F (36.4 C)  SpO2: 98%  Weight: 138 lb 3.2 oz (62.7 kg)  Height: 5\' 6"  (1.676 m)   Body mass index is 22.31 kg/m.  Generalized: Well developed, in no acute distress   Neurological examination  Mentation: Alert oriented to time, place, history taking. Follows all commands speech and language fluent Cranial nerve II-XII: Pupils were equal round reactive to light. Extraocular movements were full, visual field were full on confrontational test. Facial sensation and strength were normal.  Head turning and shoulder shrug  were normal and symmetric.  Able to completely close bilateral eyes, able to raise left eyebrow 50%, asymmetry to left mouth when smiling Motor: The motor testing reveals 5 over 5 strength of all 4 extremities. Good symmetric motor tone is noted throughout.  Sensory: Sensory testing is intact to soft touch on all 4 extremities. No evidence of extinction is noted.  Coordination: Cerebellar testing reveals good finger-nose-finger and heel-to-shin bilaterally.  Gait and station: Gait is normal. Tandem gait is normal. Romberg is negative. No drift is seen.  Reflexes: Deep tendon reflexes are symmetric and normal bilaterally.   DIAGNOSTIC DATA (LABS, IMAGING, TESTING) - I reviewed patient records, labs, notes, testing and imaging myself where available.  Lab Results  Component Value Date   WBC 7.1 12/03/2015   HGB 13.5 12/03/2015   HCT 40.6 12/03/2015   MCV 85.5 12/03/2015   PLT 269 12/03/2015      Component Value Date/Time   NA 142 05/02/2018 0811   K 3.5 05/02/2018 0811   CL 103 05/02/2018 0811   CO2 22 05/02/2018 0811   GLUCOSE 88 05/02/2018 0811   GLUCOSE 77 12/03/2015 1146   BUN 16 05/02/2018 0811   CREATININE 0.74 05/02/2018 0811   CALCIUM 9.8 05/02/2018 0811   PROT 6.5 05/02/2018 0811   ALBUMIN 4.3 05/02/2018 0811   AST 18 05/02/2018 8616  ALT 17 05/02/2018 0811   ALKPHOS 120 (H) 05/02/2018 0811   BILITOT 0.9 05/02/2018 0811   GFRNONAA 84 05/02/2018 0811   GFRAA 96 05/02/2018 0811   No results found for: CHOL, HDL, LDLCALC, LDLDIRECT, TRIG, CHOLHDL Lab Results  Component Value Date   HGBA1C 5.8 (H) 05/02/2018   No results found for: VITAMINB12 No results found for: TSH  ASSESSMENT AND PLAN 68 y.o. year old female  has a past medical history of Fibromyalgia, GERD (gastroesophageal reflux disease), Hypertension, Hypothyroidism, and Left-sided Bell's palsy (05/02/2018). here with:  1.  Left-sided Bell's palsy  She feels that she has had a 70% improvement in her symptoms.  She continues to have some slurred speech, difficulty with "P" and "F" words, and drooling.  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  We discussed she may benefit from speech therapy evaluation.  She has a daughter-in-law who is a speech therapist and she will discuss this with her.  I encouraged her to continue close follow-up with her primary doctor, along with routine check of her A1c, was mildly elevated at 5.8.  I encouraged her to continue to use her eyedrops, ointment as needed. At this point, she is 9 months out from the initial event, her symptoms may be lasting.  She will follow-up at this office on an as-needed basis.  I did advise if her symptoms worsen or she develops any new symptoms she should let us know.  I spent 15 minutes with the patient. 50% of this time was spent discussing her plan of care.   Margie EgeSarah Hilbert Briggs, AGNP-C, DNP 01/01/2019, 9:18 AM [facility] [address], [address] [place], [place] [address] [phone]

## 2019-01-01 ENCOUNTER — Encounter: Payer: Self-pay | Admitting: Neurology

## 2019-01-01 ENCOUNTER — Other Ambulatory Visit: Payer: Self-pay

## 2019-01-01 ENCOUNTER — Ambulatory Visit (INDEPENDENT_AMBULATORY_CARE_PROVIDER_SITE_OTHER): Payer: Medicare Other | Admitting: Neurology

## 2019-01-01 VITALS — BP 124/72 | HR 78 | Temp 97.5°F | Ht 66.0 in | Wt 138.2 lb

## 2019-01-01 DIAGNOSIS — G510 Bell's palsy: Secondary | ICD-10-CM

## 2019-01-01 NOTE — Progress Notes (Signed)
I have read the note, and I agree with the clinical assessment and plan.  Madeline Middleton   

## 2019-01-01 NOTE — Patient Instructions (Signed)
Have your primary continue to check A1C, was 5.8 in January 2020, continue use of ointment and drops as needed.

## 2019-01-03 DIAGNOSIS — G510 Bell's palsy: Secondary | ICD-10-CM | POA: Diagnosis not present

## 2019-01-03 DIAGNOSIS — M47812 Spondylosis without myelopathy or radiculopathy, cervical region: Secondary | ICD-10-CM | POA: Diagnosis not present

## 2019-01-03 DIAGNOSIS — M9901 Segmental and somatic dysfunction of cervical region: Secondary | ICD-10-CM | POA: Diagnosis not present

## 2019-01-10 DIAGNOSIS — Z713 Dietary counseling and surveillance: Secondary | ICD-10-CM | POA: Diagnosis not present

## 2019-01-10 DIAGNOSIS — Z6820 Body mass index (BMI) 20.0-20.9, adult: Secondary | ICD-10-CM | POA: Diagnosis not present

## 2019-01-10 DIAGNOSIS — I10 Essential (primary) hypertension: Secondary | ICD-10-CM | POA: Diagnosis not present

## 2019-01-10 DIAGNOSIS — Z299 Encounter for prophylactic measures, unspecified: Secondary | ICD-10-CM | POA: Diagnosis not present

## 2019-01-14 ENCOUNTER — Encounter (HOSPITAL_COMMUNITY): Payer: Self-pay

## 2019-01-14 ENCOUNTER — Ambulatory Visit (HOSPITAL_COMMUNITY)
Admission: RE | Admit: 2019-01-14 | Discharge: 2019-01-14 | Disposition: A | Discharge status: Home / Self Care | Payer: Medicare Other | Source: Ambulatory Visit | Attending: Internal Medicine | Admitting: Internal Medicine

## 2019-01-14 ENCOUNTER — Other Ambulatory Visit: Payer: Self-pay

## 2019-01-14 ENCOUNTER — Ambulatory Visit (HOSPITAL_COMMUNITY): Payer: Medicare Other

## 2019-01-14 DIAGNOSIS — Z1231 Encounter for screening mammogram for malignant neoplasm of breast: Secondary | ICD-10-CM | POA: Insufficient documentation

## 2019-01-17 DIAGNOSIS — M9901 Segmental and somatic dysfunction of cervical region: Secondary | ICD-10-CM | POA: Diagnosis not present

## 2019-01-17 DIAGNOSIS — M47812 Spondylosis without myelopathy or radiculopathy, cervical region: Secondary | ICD-10-CM | POA: Diagnosis not present

## 2019-01-17 DIAGNOSIS — G510 Bell's palsy: Secondary | ICD-10-CM | POA: Diagnosis not present

## 2019-01-31 DIAGNOSIS — M9901 Segmental and somatic dysfunction of cervical region: Secondary | ICD-10-CM | POA: Diagnosis not present

## 2019-01-31 DIAGNOSIS — G510 Bell's palsy: Secondary | ICD-10-CM | POA: Diagnosis not present

## 2019-01-31 DIAGNOSIS — M47812 Spondylosis without myelopathy or radiculopathy, cervical region: Secondary | ICD-10-CM | POA: Diagnosis not present

## 2019-02-26 DIAGNOSIS — Z23 Encounter for immunization: Secondary | ICD-10-CM | POA: Diagnosis not present

## 2019-02-28 DIAGNOSIS — M9901 Segmental and somatic dysfunction of cervical region: Secondary | ICD-10-CM | POA: Diagnosis not present

## 2019-02-28 DIAGNOSIS — M47812 Spondylosis without myelopathy or radiculopathy, cervical region: Secondary | ICD-10-CM | POA: Diagnosis not present

## 2019-02-28 DIAGNOSIS — G510 Bell's palsy: Secondary | ICD-10-CM | POA: Diagnosis not present

## 2019-03-28 DIAGNOSIS — M47812 Spondylosis without myelopathy or radiculopathy, cervical region: Secondary | ICD-10-CM | POA: Diagnosis not present

## 2019-03-28 DIAGNOSIS — G510 Bell's palsy: Secondary | ICD-10-CM | POA: Diagnosis not present

## 2019-03-28 DIAGNOSIS — M9901 Segmental and somatic dysfunction of cervical region: Secondary | ICD-10-CM | POA: Diagnosis not present

## 2019-05-23 DIAGNOSIS — M9901 Segmental and somatic dysfunction of cervical region: Secondary | ICD-10-CM | POA: Diagnosis not present

## 2019-05-23 DIAGNOSIS — G510 Bell's palsy: Secondary | ICD-10-CM | POA: Diagnosis not present

## 2019-05-23 DIAGNOSIS — M47812 Spondylosis without myelopathy or radiculopathy, cervical region: Secondary | ICD-10-CM | POA: Diagnosis not present

## 2019-05-30 DIAGNOSIS — Z23 Encounter for immunization: Secondary | ICD-10-CM | POA: Diagnosis not present

## 2019-06-28 DIAGNOSIS — Z23 Encounter for immunization: Secondary | ICD-10-CM | POA: Diagnosis not present

## 2019-07-11 DIAGNOSIS — Z299 Encounter for prophylactic measures, unspecified: Secondary | ICD-10-CM | POA: Diagnosis not present

## 2019-07-11 DIAGNOSIS — I10 Essential (primary) hypertension: Secondary | ICD-10-CM | POA: Diagnosis not present

## 2019-07-11 DIAGNOSIS — G510 Bell's palsy: Secondary | ICD-10-CM | POA: Diagnosis not present

## 2019-07-11 DIAGNOSIS — E039 Hypothyroidism, unspecified: Secondary | ICD-10-CM | POA: Diagnosis not present

## 2019-07-11 DIAGNOSIS — Z789 Other specified health status: Secondary | ICD-10-CM | POA: Diagnosis not present

## 2019-07-18 DIAGNOSIS — M9901 Segmental and somatic dysfunction of cervical region: Secondary | ICD-10-CM | POA: Diagnosis not present

## 2019-07-18 DIAGNOSIS — M47812 Spondylosis without myelopathy or radiculopathy, cervical region: Secondary | ICD-10-CM | POA: Diagnosis not present

## 2019-07-18 DIAGNOSIS — G510 Bell's palsy: Secondary | ICD-10-CM | POA: Diagnosis not present

## 2019-09-11 DIAGNOSIS — Z299 Encounter for prophylactic measures, unspecified: Secondary | ICD-10-CM | POA: Diagnosis not present

## 2019-09-11 DIAGNOSIS — B029 Zoster without complications: Secondary | ICD-10-CM | POA: Diagnosis not present

## 2019-09-11 DIAGNOSIS — I10 Essential (primary) hypertension: Secondary | ICD-10-CM | POA: Diagnosis not present

## 2019-09-24 DIAGNOSIS — I10 Essential (primary) hypertension: Secondary | ICD-10-CM | POA: Diagnosis not present

## 2019-09-24 DIAGNOSIS — Z6822 Body mass index (BMI) 22.0-22.9, adult: Secondary | ICD-10-CM | POA: Diagnosis not present

## 2019-09-24 DIAGNOSIS — R42 Dizziness and giddiness: Secondary | ICD-10-CM | POA: Diagnosis not present

## 2019-09-24 DIAGNOSIS — B029 Zoster without complications: Secondary | ICD-10-CM | POA: Diagnosis not present

## 2019-09-24 DIAGNOSIS — Z299 Encounter for prophylactic measures, unspecified: Secondary | ICD-10-CM | POA: Diagnosis not present

## 2019-09-24 DIAGNOSIS — H04123 Dry eye syndrome of bilateral lacrimal glands: Secondary | ICD-10-CM | POA: Diagnosis not present

## 2019-12-16 ENCOUNTER — Other Ambulatory Visit (HOSPITAL_COMMUNITY): Payer: Self-pay | Admitting: Nurse Practitioner

## 2019-12-16 DIAGNOSIS — Z1231 Encounter for screening mammogram for malignant neoplasm of breast: Secondary | ICD-10-CM

## 2019-12-24 DIAGNOSIS — E039 Hypothyroidism, unspecified: Secondary | ICD-10-CM | POA: Diagnosis not present

## 2019-12-24 DIAGNOSIS — E7849 Other hyperlipidemia: Secondary | ICD-10-CM | POA: Diagnosis not present

## 2019-12-24 DIAGNOSIS — I10 Essential (primary) hypertension: Secondary | ICD-10-CM | POA: Diagnosis not present

## 2020-01-17 ENCOUNTER — Other Ambulatory Visit: Payer: Self-pay

## 2020-01-17 ENCOUNTER — Ambulatory Visit (HOSPITAL_COMMUNITY)
Admission: RE | Admit: 2020-01-17 | Discharge: 2020-01-17 | Disposition: A | Discharge status: Home / Self Care | Payer: Medicare Other | Source: Ambulatory Visit | Attending: Nurse Practitioner | Admitting: Nurse Practitioner

## 2020-01-17 DIAGNOSIS — Z1231 Encounter for screening mammogram for malignant neoplasm of breast: Secondary | ICD-10-CM | POA: Diagnosis not present

## 2020-01-30 DIAGNOSIS — E785 Hyperlipidemia, unspecified: Secondary | ICD-10-CM | POA: Diagnosis not present

## 2020-01-30 DIAGNOSIS — Z299 Encounter for prophylactic measures, unspecified: Secondary | ICD-10-CM | POA: Diagnosis not present

## 2020-01-30 DIAGNOSIS — I10 Essential (primary) hypertension: Secondary | ICD-10-CM | POA: Diagnosis not present

## 2020-01-30 DIAGNOSIS — E039 Hypothyroidism, unspecified: Secondary | ICD-10-CM | POA: Diagnosis not present

## 2020-02-19 DIAGNOSIS — M47812 Spondylosis without myelopathy or radiculopathy, cervical region: Secondary | ICD-10-CM | POA: Diagnosis not present

## 2020-02-19 DIAGNOSIS — M7551 Bursitis of right shoulder: Secondary | ICD-10-CM | POA: Diagnosis not present

## 2020-02-19 DIAGNOSIS — M9901 Segmental and somatic dysfunction of cervical region: Secondary | ICD-10-CM | POA: Diagnosis not present

## 2020-02-21 DIAGNOSIS — M9901 Segmental and somatic dysfunction of cervical region: Secondary | ICD-10-CM | POA: Diagnosis not present

## 2020-02-21 DIAGNOSIS — M47812 Spondylosis without myelopathy or radiculopathy, cervical region: Secondary | ICD-10-CM | POA: Diagnosis not present

## 2020-02-21 DIAGNOSIS — M7551 Bursitis of right shoulder: Secondary | ICD-10-CM | POA: Diagnosis not present

## 2020-02-24 DIAGNOSIS — M9901 Segmental and somatic dysfunction of cervical region: Secondary | ICD-10-CM | POA: Diagnosis not present

## 2020-02-24 DIAGNOSIS — M47812 Spondylosis without myelopathy or radiculopathy, cervical region: Secondary | ICD-10-CM | POA: Diagnosis not present

## 2020-02-24 DIAGNOSIS — M7551 Bursitis of right shoulder: Secondary | ICD-10-CM | POA: Diagnosis not present

## 2020-02-26 DIAGNOSIS — M9901 Segmental and somatic dysfunction of cervical region: Secondary | ICD-10-CM | POA: Diagnosis not present

## 2020-02-26 DIAGNOSIS — M47812 Spondylosis without myelopathy or radiculopathy, cervical region: Secondary | ICD-10-CM | POA: Diagnosis not present

## 2020-02-26 DIAGNOSIS — M7551 Bursitis of right shoulder: Secondary | ICD-10-CM | POA: Diagnosis not present

## 2020-02-28 DIAGNOSIS — M7551 Bursitis of right shoulder: Secondary | ICD-10-CM | POA: Diagnosis not present

## 2020-02-28 DIAGNOSIS — M47812 Spondylosis without myelopathy or radiculopathy, cervical region: Secondary | ICD-10-CM | POA: Diagnosis not present

## 2020-02-28 DIAGNOSIS — M9901 Segmental and somatic dysfunction of cervical region: Secondary | ICD-10-CM | POA: Diagnosis not present

## 2020-02-29 DIAGNOSIS — Z23 Encounter for immunization: Secondary | ICD-10-CM | POA: Diagnosis not present

## 2020-03-02 DIAGNOSIS — M7551 Bursitis of right shoulder: Secondary | ICD-10-CM | POA: Diagnosis not present

## 2020-03-02 DIAGNOSIS — M9901 Segmental and somatic dysfunction of cervical region: Secondary | ICD-10-CM | POA: Diagnosis not present

## 2020-03-02 DIAGNOSIS — M47812 Spondylosis without myelopathy or radiculopathy, cervical region: Secondary | ICD-10-CM | POA: Diagnosis not present

## 2020-03-05 DIAGNOSIS — M9901 Segmental and somatic dysfunction of cervical region: Secondary | ICD-10-CM | POA: Diagnosis not present

## 2020-03-05 DIAGNOSIS — M7551 Bursitis of right shoulder: Secondary | ICD-10-CM | POA: Diagnosis not present

## 2020-03-05 DIAGNOSIS — M47812 Spondylosis without myelopathy or radiculopathy, cervical region: Secondary | ICD-10-CM | POA: Diagnosis not present

## 2020-03-09 DIAGNOSIS — M7551 Bursitis of right shoulder: Secondary | ICD-10-CM | POA: Diagnosis not present

## 2020-03-09 DIAGNOSIS — M9901 Segmental and somatic dysfunction of cervical region: Secondary | ICD-10-CM | POA: Diagnosis not present

## 2020-03-09 DIAGNOSIS — M47812 Spondylosis without myelopathy or radiculopathy, cervical region: Secondary | ICD-10-CM | POA: Diagnosis not present

## 2020-03-13 DIAGNOSIS — M9901 Segmental and somatic dysfunction of cervical region: Secondary | ICD-10-CM | POA: Diagnosis not present

## 2020-03-13 DIAGNOSIS — M7551 Bursitis of right shoulder: Secondary | ICD-10-CM | POA: Diagnosis not present

## 2020-03-13 DIAGNOSIS — M47812 Spondylosis without myelopathy or radiculopathy, cervical region: Secondary | ICD-10-CM | POA: Diagnosis not present

## 2020-03-23 DIAGNOSIS — M9901 Segmental and somatic dysfunction of cervical region: Secondary | ICD-10-CM | POA: Diagnosis not present

## 2020-03-23 DIAGNOSIS — M7551 Bursitis of right shoulder: Secondary | ICD-10-CM | POA: Diagnosis not present

## 2020-03-23 DIAGNOSIS — M47812 Spondylosis without myelopathy or radiculopathy, cervical region: Secondary | ICD-10-CM | POA: Diagnosis not present

## 2020-03-24 DIAGNOSIS — I10 Essential (primary) hypertension: Secondary | ICD-10-CM | POA: Diagnosis not present

## 2020-03-24 DIAGNOSIS — E039 Hypothyroidism, unspecified: Secondary | ICD-10-CM | POA: Diagnosis not present

## 2020-04-06 DIAGNOSIS — M7551 Bursitis of right shoulder: Secondary | ICD-10-CM | POA: Diagnosis not present

## 2020-04-06 DIAGNOSIS — M47812 Spondylosis without myelopathy or radiculopathy, cervical region: Secondary | ICD-10-CM | POA: Diagnosis not present

## 2020-04-06 DIAGNOSIS — M9901 Segmental and somatic dysfunction of cervical region: Secondary | ICD-10-CM | POA: Diagnosis not present

## 2020-04-20 DIAGNOSIS — M47812 Spondylosis without myelopathy or radiculopathy, cervical region: Secondary | ICD-10-CM | POA: Diagnosis not present

## 2020-04-20 DIAGNOSIS — M7551 Bursitis of right shoulder: Secondary | ICD-10-CM | POA: Diagnosis not present

## 2020-04-20 DIAGNOSIS — M9901 Segmental and somatic dysfunction of cervical region: Secondary | ICD-10-CM | POA: Diagnosis not present

## 2020-04-24 DIAGNOSIS — I10 Essential (primary) hypertension: Secondary | ICD-10-CM | POA: Diagnosis not present

## 2020-04-24 DIAGNOSIS — E039 Hypothyroidism, unspecified: Secondary | ICD-10-CM | POA: Diagnosis not present

## 2020-05-04 DIAGNOSIS — M47812 Spondylosis without myelopathy or radiculopathy, cervical region: Secondary | ICD-10-CM | POA: Diagnosis not present

## 2020-05-04 DIAGNOSIS — M9901 Segmental and somatic dysfunction of cervical region: Secondary | ICD-10-CM | POA: Diagnosis not present

## 2020-05-04 DIAGNOSIS — M7551 Bursitis of right shoulder: Secondary | ICD-10-CM | POA: Diagnosis not present

## 2020-05-06 DIAGNOSIS — E039 Hypothyroidism, unspecified: Secondary | ICD-10-CM | POA: Diagnosis not present

## 2020-05-06 DIAGNOSIS — Z299 Encounter for prophylactic measures, unspecified: Secondary | ICD-10-CM | POA: Diagnosis not present

## 2020-05-06 DIAGNOSIS — I10 Essential (primary) hypertension: Secondary | ICD-10-CM | POA: Diagnosis not present

## 2020-05-06 DIAGNOSIS — Z6823 Body mass index (BMI) 23.0-23.9, adult: Secondary | ICD-10-CM | POA: Diagnosis not present

## 2020-05-06 DIAGNOSIS — Z789 Other specified health status: Secondary | ICD-10-CM | POA: Diagnosis not present

## 2020-05-18 DIAGNOSIS — M9901 Segmental and somatic dysfunction of cervical region: Secondary | ICD-10-CM | POA: Diagnosis not present

## 2020-05-18 DIAGNOSIS — M47812 Spondylosis without myelopathy or radiculopathy, cervical region: Secondary | ICD-10-CM | POA: Diagnosis not present

## 2020-05-18 DIAGNOSIS — M7551 Bursitis of right shoulder: Secondary | ICD-10-CM | POA: Diagnosis not present

## 2020-06-01 DIAGNOSIS — M47812 Spondylosis without myelopathy or radiculopathy, cervical region: Secondary | ICD-10-CM | POA: Diagnosis not present

## 2020-06-01 DIAGNOSIS — M7551 Bursitis of right shoulder: Secondary | ICD-10-CM | POA: Diagnosis not present

## 2020-06-01 DIAGNOSIS — M9901 Segmental and somatic dysfunction of cervical region: Secondary | ICD-10-CM | POA: Diagnosis not present

## 2020-06-15 DIAGNOSIS — M47812 Spondylosis without myelopathy or radiculopathy, cervical region: Secondary | ICD-10-CM | POA: Diagnosis not present

## 2020-06-15 DIAGNOSIS — M7551 Bursitis of right shoulder: Secondary | ICD-10-CM | POA: Diagnosis not present

## 2020-06-15 DIAGNOSIS — M9901 Segmental and somatic dysfunction of cervical region: Secondary | ICD-10-CM | POA: Diagnosis not present

## 2020-06-22 DIAGNOSIS — I10 Essential (primary) hypertension: Secondary | ICD-10-CM | POA: Diagnosis not present

## 2020-06-22 DIAGNOSIS — E039 Hypothyroidism, unspecified: Secondary | ICD-10-CM | POA: Diagnosis not present

## 2020-07-13 DIAGNOSIS — M47812 Spondylosis without myelopathy or radiculopathy, cervical region: Secondary | ICD-10-CM | POA: Diagnosis not present

## 2020-07-13 DIAGNOSIS — M9901 Segmental and somatic dysfunction of cervical region: Secondary | ICD-10-CM | POA: Diagnosis not present

## 2020-07-13 DIAGNOSIS — M7551 Bursitis of right shoulder: Secondary | ICD-10-CM | POA: Diagnosis not present

## 2020-07-24 DIAGNOSIS — M25511 Pain in right shoulder: Secondary | ICD-10-CM | POA: Diagnosis not present

## 2020-07-27 DIAGNOSIS — M9901 Segmental and somatic dysfunction of cervical region: Secondary | ICD-10-CM | POA: Diagnosis not present

## 2020-07-27 DIAGNOSIS — M47812 Spondylosis without myelopathy or radiculopathy, cervical region: Secondary | ICD-10-CM | POA: Diagnosis not present

## 2020-07-27 DIAGNOSIS — M7551 Bursitis of right shoulder: Secondary | ICD-10-CM | POA: Diagnosis not present

## 2020-08-10 DIAGNOSIS — M7551 Bursitis of right shoulder: Secondary | ICD-10-CM | POA: Diagnosis not present

## 2020-08-10 DIAGNOSIS — M47812 Spondylosis without myelopathy or radiculopathy, cervical region: Secondary | ICD-10-CM | POA: Diagnosis not present

## 2020-08-10 DIAGNOSIS — M9901 Segmental and somatic dysfunction of cervical region: Secondary | ICD-10-CM | POA: Diagnosis not present

## 2020-08-12 DIAGNOSIS — Z6823 Body mass index (BMI) 23.0-23.9, adult: Secondary | ICD-10-CM | POA: Diagnosis not present

## 2020-08-12 DIAGNOSIS — Z789 Other specified health status: Secondary | ICD-10-CM | POA: Diagnosis not present

## 2020-08-12 DIAGNOSIS — M7551 Bursitis of right shoulder: Secondary | ICD-10-CM | POA: Diagnosis not present

## 2020-08-12 DIAGNOSIS — E039 Hypothyroidism, unspecified: Secondary | ICD-10-CM | POA: Diagnosis not present

## 2020-08-12 DIAGNOSIS — M67911 Unspecified disorder of synovium and tendon, right shoulder: Secondary | ICD-10-CM | POA: Diagnosis not present

## 2020-08-12 DIAGNOSIS — I10 Essential (primary) hypertension: Secondary | ICD-10-CM | POA: Diagnosis not present

## 2020-08-12 DIAGNOSIS — Z299 Encounter for prophylactic measures, unspecified: Secondary | ICD-10-CM | POA: Diagnosis not present

## 2020-08-12 DIAGNOSIS — M25511 Pain in right shoulder: Secondary | ICD-10-CM | POA: Diagnosis not present

## 2020-08-13 DIAGNOSIS — M25511 Pain in right shoulder: Secondary | ICD-10-CM | POA: Diagnosis not present

## 2020-08-13 DIAGNOSIS — M7551 Bursitis of right shoulder: Secondary | ICD-10-CM | POA: Diagnosis not present

## 2020-08-13 DIAGNOSIS — M67911 Unspecified disorder of synovium and tendon, right shoulder: Secondary | ICD-10-CM | POA: Diagnosis not present

## 2020-08-18 DIAGNOSIS — M7551 Bursitis of right shoulder: Secondary | ICD-10-CM | POA: Diagnosis not present

## 2020-08-18 DIAGNOSIS — M25511 Pain in right shoulder: Secondary | ICD-10-CM | POA: Diagnosis not present

## 2020-08-18 DIAGNOSIS — M67911 Unspecified disorder of synovium and tendon, right shoulder: Secondary | ICD-10-CM | POA: Diagnosis not present

## 2020-08-20 DIAGNOSIS — M25511 Pain in right shoulder: Secondary | ICD-10-CM | POA: Diagnosis not present

## 2020-08-20 DIAGNOSIS — M7551 Bursitis of right shoulder: Secondary | ICD-10-CM | POA: Diagnosis not present

## 2020-08-20 DIAGNOSIS — M67911 Unspecified disorder of synovium and tendon, right shoulder: Secondary | ICD-10-CM | POA: Diagnosis not present

## 2020-08-22 DIAGNOSIS — I10 Essential (primary) hypertension: Secondary | ICD-10-CM | POA: Diagnosis not present

## 2020-08-22 DIAGNOSIS — E039 Hypothyroidism, unspecified: Secondary | ICD-10-CM | POA: Diagnosis not present

## 2020-08-24 DIAGNOSIS — M9901 Segmental and somatic dysfunction of cervical region: Secondary | ICD-10-CM | POA: Diagnosis not present

## 2020-08-24 DIAGNOSIS — M7551 Bursitis of right shoulder: Secondary | ICD-10-CM | POA: Diagnosis not present

## 2020-08-24 DIAGNOSIS — M47812 Spondylosis without myelopathy or radiculopathy, cervical region: Secondary | ICD-10-CM | POA: Diagnosis not present

## 2020-08-25 DIAGNOSIS — M67911 Unspecified disorder of synovium and tendon, right shoulder: Secondary | ICD-10-CM | POA: Diagnosis not present

## 2020-08-25 DIAGNOSIS — M7551 Bursitis of right shoulder: Secondary | ICD-10-CM | POA: Diagnosis not present

## 2020-08-25 DIAGNOSIS — M25511 Pain in right shoulder: Secondary | ICD-10-CM | POA: Diagnosis not present

## 2020-08-27 DIAGNOSIS — M67911 Unspecified disorder of synovium and tendon, right shoulder: Secondary | ICD-10-CM | POA: Diagnosis not present

## 2020-08-27 DIAGNOSIS — M7551 Bursitis of right shoulder: Secondary | ICD-10-CM | POA: Diagnosis not present

## 2020-08-27 DIAGNOSIS — M25511 Pain in right shoulder: Secondary | ICD-10-CM | POA: Diagnosis not present

## 2020-09-01 DIAGNOSIS — M25511 Pain in right shoulder: Secondary | ICD-10-CM | POA: Diagnosis not present

## 2020-09-01 DIAGNOSIS — M67911 Unspecified disorder of synovium and tendon, right shoulder: Secondary | ICD-10-CM | POA: Diagnosis not present

## 2020-09-01 DIAGNOSIS — M7551 Bursitis of right shoulder: Secondary | ICD-10-CM | POA: Diagnosis not present

## 2020-09-03 DIAGNOSIS — M25511 Pain in right shoulder: Secondary | ICD-10-CM | POA: Diagnosis not present

## 2020-09-03 DIAGNOSIS — M7551 Bursitis of right shoulder: Secondary | ICD-10-CM | POA: Diagnosis not present

## 2020-09-03 DIAGNOSIS — M67911 Unspecified disorder of synovium and tendon, right shoulder: Secondary | ICD-10-CM | POA: Diagnosis not present

## 2020-09-07 DIAGNOSIS — M25511 Pain in right shoulder: Secondary | ICD-10-CM | POA: Diagnosis not present

## 2020-09-07 DIAGNOSIS — M7551 Bursitis of right shoulder: Secondary | ICD-10-CM | POA: Diagnosis not present

## 2020-09-07 DIAGNOSIS — M47812 Spondylosis without myelopathy or radiculopathy, cervical region: Secondary | ICD-10-CM | POA: Diagnosis not present

## 2020-09-07 DIAGNOSIS — M9901 Segmental and somatic dysfunction of cervical region: Secondary | ICD-10-CM | POA: Diagnosis not present

## 2020-09-07 DIAGNOSIS — M67911 Unspecified disorder of synovium and tendon, right shoulder: Secondary | ICD-10-CM | POA: Diagnosis not present

## 2020-09-10 DIAGNOSIS — M25511 Pain in right shoulder: Secondary | ICD-10-CM | POA: Diagnosis not present

## 2020-09-10 DIAGNOSIS — M7551 Bursitis of right shoulder: Secondary | ICD-10-CM | POA: Diagnosis not present

## 2020-09-10 DIAGNOSIS — M67911 Unspecified disorder of synovium and tendon, right shoulder: Secondary | ICD-10-CM | POA: Diagnosis not present

## 2020-09-11 DIAGNOSIS — M25511 Pain in right shoulder: Secondary | ICD-10-CM | POA: Diagnosis not present

## 2020-09-14 DIAGNOSIS — M7551 Bursitis of right shoulder: Secondary | ICD-10-CM | POA: Diagnosis not present

## 2020-09-14 DIAGNOSIS — M25511 Pain in right shoulder: Secondary | ICD-10-CM | POA: Diagnosis not present

## 2020-09-14 DIAGNOSIS — M67911 Unspecified disorder of synovium and tendon, right shoulder: Secondary | ICD-10-CM | POA: Diagnosis not present

## 2020-09-17 DIAGNOSIS — M67911 Unspecified disorder of synovium and tendon, right shoulder: Secondary | ICD-10-CM | POA: Diagnosis not present

## 2020-09-17 DIAGNOSIS — M25511 Pain in right shoulder: Secondary | ICD-10-CM | POA: Diagnosis not present

## 2020-09-17 DIAGNOSIS — M7551 Bursitis of right shoulder: Secondary | ICD-10-CM | POA: Diagnosis not present

## 2020-09-23 DIAGNOSIS — M25511 Pain in right shoulder: Secondary | ICD-10-CM | POA: Diagnosis not present

## 2020-09-23 DIAGNOSIS — M47812 Spondylosis without myelopathy or radiculopathy, cervical region: Secondary | ICD-10-CM | POA: Diagnosis not present

## 2020-09-23 DIAGNOSIS — M7551 Bursitis of right shoulder: Secondary | ICD-10-CM | POA: Diagnosis not present

## 2020-09-23 DIAGNOSIS — M67911 Unspecified disorder of synovium and tendon, right shoulder: Secondary | ICD-10-CM | POA: Diagnosis not present

## 2020-09-23 DIAGNOSIS — M9901 Segmental and somatic dysfunction of cervical region: Secondary | ICD-10-CM | POA: Diagnosis not present

## 2020-09-28 DIAGNOSIS — M67911 Unspecified disorder of synovium and tendon, right shoulder: Secondary | ICD-10-CM | POA: Diagnosis not present

## 2020-09-28 DIAGNOSIS — M7551 Bursitis of right shoulder: Secondary | ICD-10-CM | POA: Diagnosis not present

## 2020-09-28 DIAGNOSIS — M25511 Pain in right shoulder: Secondary | ICD-10-CM | POA: Diagnosis not present

## 2020-10-01 DIAGNOSIS — M25511 Pain in right shoulder: Secondary | ICD-10-CM | POA: Diagnosis not present

## 2020-10-01 DIAGNOSIS — M67911 Unspecified disorder of synovium and tendon, right shoulder: Secondary | ICD-10-CM | POA: Diagnosis not present

## 2020-10-01 DIAGNOSIS — M7551 Bursitis of right shoulder: Secondary | ICD-10-CM | POA: Diagnosis not present

## 2020-10-05 DIAGNOSIS — M7551 Bursitis of right shoulder: Secondary | ICD-10-CM | POA: Diagnosis not present

## 2020-10-05 DIAGNOSIS — M25511 Pain in right shoulder: Secondary | ICD-10-CM | POA: Diagnosis not present

## 2020-10-05 DIAGNOSIS — M67911 Unspecified disorder of synovium and tendon, right shoulder: Secondary | ICD-10-CM | POA: Diagnosis not present

## 2020-10-08 DIAGNOSIS — M67911 Unspecified disorder of synovium and tendon, right shoulder: Secondary | ICD-10-CM | POA: Diagnosis not present

## 2020-10-08 DIAGNOSIS — M7551 Bursitis of right shoulder: Secondary | ICD-10-CM | POA: Diagnosis not present

## 2020-10-08 DIAGNOSIS — M25511 Pain in right shoulder: Secondary | ICD-10-CM | POA: Diagnosis not present

## 2020-10-08 DIAGNOSIS — M47812 Spondylosis without myelopathy or radiculopathy, cervical region: Secondary | ICD-10-CM | POA: Diagnosis not present

## 2020-10-08 DIAGNOSIS — M9901 Segmental and somatic dysfunction of cervical region: Secondary | ICD-10-CM | POA: Diagnosis not present

## 2020-11-04 DIAGNOSIS — M47812 Spondylosis without myelopathy or radiculopathy, cervical region: Secondary | ICD-10-CM | POA: Diagnosis not present

## 2020-11-04 DIAGNOSIS — M7551 Bursitis of right shoulder: Secondary | ICD-10-CM | POA: Diagnosis not present

## 2020-11-04 DIAGNOSIS — M9901 Segmental and somatic dysfunction of cervical region: Secondary | ICD-10-CM | POA: Diagnosis not present

## 2020-11-05 DIAGNOSIS — Z0000 Encounter for general adult medical examination without abnormal findings: Secondary | ICD-10-CM | POA: Diagnosis not present

## 2020-11-05 DIAGNOSIS — Z1331 Encounter for screening for depression: Secondary | ICD-10-CM | POA: Diagnosis not present

## 2020-11-05 DIAGNOSIS — Z6822 Body mass index (BMI) 22.0-22.9, adult: Secondary | ICD-10-CM | POA: Diagnosis not present

## 2020-11-05 DIAGNOSIS — I10 Essential (primary) hypertension: Secondary | ICD-10-CM | POA: Diagnosis not present

## 2020-11-05 DIAGNOSIS — Z79899 Other long term (current) drug therapy: Secondary | ICD-10-CM | POA: Diagnosis not present

## 2020-11-05 DIAGNOSIS — Z299 Encounter for prophylactic measures, unspecified: Secondary | ICD-10-CM | POA: Diagnosis not present

## 2020-11-05 DIAGNOSIS — E7800 Pure hypercholesterolemia, unspecified: Secondary | ICD-10-CM | POA: Diagnosis not present

## 2020-11-05 DIAGNOSIS — Z7189 Other specified counseling: Secondary | ICD-10-CM | POA: Diagnosis not present

## 2020-11-05 DIAGNOSIS — E039 Hypothyroidism, unspecified: Secondary | ICD-10-CM | POA: Diagnosis not present

## 2020-11-05 DIAGNOSIS — R5383 Other fatigue: Secondary | ICD-10-CM | POA: Diagnosis not present

## 2020-11-05 DIAGNOSIS — Z789 Other specified health status: Secondary | ICD-10-CM | POA: Diagnosis not present

## 2020-11-05 DIAGNOSIS — Z1339 Encounter for screening examination for other mental health and behavioral disorders: Secondary | ICD-10-CM | POA: Diagnosis not present

## 2020-12-01 DIAGNOSIS — Z7952 Long term (current) use of systemic steroids: Secondary | ICD-10-CM | POA: Diagnosis not present

## 2020-12-01 DIAGNOSIS — Z79899 Other long term (current) drug therapy: Secondary | ICD-10-CM | POA: Diagnosis not present

## 2020-12-01 DIAGNOSIS — M859 Disorder of bone density and structure, unspecified: Secondary | ICD-10-CM | POA: Diagnosis not present

## 2020-12-01 DIAGNOSIS — E2839 Other primary ovarian failure: Secondary | ICD-10-CM | POA: Diagnosis not present

## 2020-12-03 DIAGNOSIS — M7551 Bursitis of right shoulder: Secondary | ICD-10-CM | POA: Diagnosis not present

## 2020-12-03 DIAGNOSIS — M47812 Spondylosis without myelopathy or radiculopathy, cervical region: Secondary | ICD-10-CM | POA: Diagnosis not present

## 2020-12-03 DIAGNOSIS — M9901 Segmental and somatic dysfunction of cervical region: Secondary | ICD-10-CM | POA: Diagnosis not present

## 2020-12-09 DIAGNOSIS — R42 Dizziness and giddiness: Secondary | ICD-10-CM | POA: Diagnosis not present

## 2020-12-09 DIAGNOSIS — Z299 Encounter for prophylactic measures, unspecified: Secondary | ICD-10-CM | POA: Diagnosis not present

## 2020-12-09 DIAGNOSIS — I10 Essential (primary) hypertension: Secondary | ICD-10-CM | POA: Diagnosis not present

## 2020-12-16 DIAGNOSIS — Z299 Encounter for prophylactic measures, unspecified: Secondary | ICD-10-CM | POA: Diagnosis not present

## 2020-12-16 DIAGNOSIS — M810 Age-related osteoporosis without current pathological fracture: Secondary | ICD-10-CM | POA: Diagnosis not present

## 2020-12-16 DIAGNOSIS — I10 Essential (primary) hypertension: Secondary | ICD-10-CM | POA: Diagnosis not present

## 2020-12-22 DIAGNOSIS — R42 Dizziness and giddiness: Secondary | ICD-10-CM | POA: Diagnosis not present

## 2020-12-22 DIAGNOSIS — H81399 Other peripheral vertigo, unspecified ear: Secondary | ICD-10-CM | POA: Diagnosis not present

## 2020-12-23 DIAGNOSIS — R42 Dizziness and giddiness: Secondary | ICD-10-CM | POA: Diagnosis not present

## 2020-12-23 DIAGNOSIS — H81399 Other peripheral vertigo, unspecified ear: Secondary | ICD-10-CM | POA: Diagnosis not present

## 2020-12-25 DIAGNOSIS — R42 Dizziness and giddiness: Secondary | ICD-10-CM | POA: Diagnosis not present

## 2020-12-25 DIAGNOSIS — H81399 Other peripheral vertigo, unspecified ear: Secondary | ICD-10-CM | POA: Diagnosis not present

## 2020-12-31 DIAGNOSIS — M810 Age-related osteoporosis without current pathological fracture: Secondary | ICD-10-CM | POA: Diagnosis not present

## 2021-01-01 DIAGNOSIS — R42 Dizziness and giddiness: Secondary | ICD-10-CM | POA: Diagnosis not present

## 2021-01-01 DIAGNOSIS — M7551 Bursitis of right shoulder: Secondary | ICD-10-CM | POA: Diagnosis not present

## 2021-01-01 DIAGNOSIS — H81399 Other peripheral vertigo, unspecified ear: Secondary | ICD-10-CM | POA: Diagnosis not present

## 2021-01-01 DIAGNOSIS — M9901 Segmental and somatic dysfunction of cervical region: Secondary | ICD-10-CM | POA: Diagnosis not present

## 2021-01-01 DIAGNOSIS — M47812 Spondylosis without myelopathy or radiculopathy, cervical region: Secondary | ICD-10-CM | POA: Diagnosis not present

## 2021-01-04 DIAGNOSIS — Z23 Encounter for immunization: Secondary | ICD-10-CM | POA: Diagnosis not present

## 2021-01-06 DIAGNOSIS — R42 Dizziness and giddiness: Secondary | ICD-10-CM | POA: Diagnosis not present

## 2021-01-06 DIAGNOSIS — H81399 Other peripheral vertigo, unspecified ear: Secondary | ICD-10-CM | POA: Diagnosis not present

## 2021-01-11 DIAGNOSIS — R42 Dizziness and giddiness: Secondary | ICD-10-CM | POA: Diagnosis not present

## 2021-01-11 DIAGNOSIS — H81399 Other peripheral vertigo, unspecified ear: Secondary | ICD-10-CM | POA: Diagnosis not present

## 2021-01-13 DIAGNOSIS — R42 Dizziness and giddiness: Secondary | ICD-10-CM | POA: Diagnosis not present

## 2021-01-13 DIAGNOSIS — H81399 Other peripheral vertigo, unspecified ear: Secondary | ICD-10-CM | POA: Diagnosis not present

## 2021-01-18 DIAGNOSIS — R42 Dizziness and giddiness: Secondary | ICD-10-CM | POA: Diagnosis not present

## 2021-01-18 DIAGNOSIS — H81399 Other peripheral vertigo, unspecified ear: Secondary | ICD-10-CM | POA: Diagnosis not present

## 2021-01-20 DIAGNOSIS — H81399 Other peripheral vertigo, unspecified ear: Secondary | ICD-10-CM | POA: Diagnosis not present

## 2021-01-20 DIAGNOSIS — R42 Dizziness and giddiness: Secondary | ICD-10-CM | POA: Diagnosis not present

## 2021-01-25 ENCOUNTER — Other Ambulatory Visit: Payer: Self-pay | Admitting: Internal Medicine

## 2021-01-25 ENCOUNTER — Other Ambulatory Visit: Payer: Self-pay

## 2021-01-25 ENCOUNTER — Ambulatory Visit
Admission: RE | Admit: 2021-01-25 | Discharge: 2021-01-25 | Disposition: A | Discharge status: Home / Self Care | Payer: Medicare Other | Source: Ambulatory Visit | Attending: Internal Medicine | Admitting: Internal Medicine

## 2021-01-25 DIAGNOSIS — Z1231 Encounter for screening mammogram for malignant neoplasm of breast: Secondary | ICD-10-CM | POA: Diagnosis not present

## 2021-01-25 DIAGNOSIS — Z139 Encounter for screening, unspecified: Secondary | ICD-10-CM

## 2021-01-29 DIAGNOSIS — M47812 Spondylosis without myelopathy or radiculopathy, cervical region: Secondary | ICD-10-CM | POA: Diagnosis not present

## 2021-01-29 DIAGNOSIS — M9901 Segmental and somatic dysfunction of cervical region: Secondary | ICD-10-CM | POA: Diagnosis not present

## 2021-01-29 DIAGNOSIS — M7551 Bursitis of right shoulder: Secondary | ICD-10-CM | POA: Diagnosis not present

## 2021-02-10 DIAGNOSIS — E039 Hypothyroidism, unspecified: Secondary | ICD-10-CM | POA: Diagnosis not present

## 2021-02-10 DIAGNOSIS — R7989 Other specified abnormal findings of blood chemistry: Secondary | ICD-10-CM | POA: Diagnosis not present

## 2021-02-10 DIAGNOSIS — R42 Dizziness and giddiness: Secondary | ICD-10-CM | POA: Diagnosis not present

## 2021-02-10 DIAGNOSIS — Z299 Encounter for prophylactic measures, unspecified: Secondary | ICD-10-CM | POA: Diagnosis not present

## 2021-02-10 DIAGNOSIS — I10 Essential (primary) hypertension: Secondary | ICD-10-CM | POA: Diagnosis not present

## 2021-02-11 DIAGNOSIS — Z23 Encounter for immunization: Secondary | ICD-10-CM | POA: Diagnosis not present

## 2021-02-15 DIAGNOSIS — I6523 Occlusion and stenosis of bilateral carotid arteries: Secondary | ICD-10-CM | POA: Diagnosis not present

## 2021-02-15 DIAGNOSIS — R42 Dizziness and giddiness: Secondary | ICD-10-CM | POA: Diagnosis not present

## 2021-04-02 DIAGNOSIS — M9901 Segmental and somatic dysfunction of cervical region: Secondary | ICD-10-CM | POA: Diagnosis not present

## 2021-04-02 DIAGNOSIS — M7551 Bursitis of right shoulder: Secondary | ICD-10-CM | POA: Diagnosis not present

## 2021-04-02 DIAGNOSIS — M47812 Spondylosis without myelopathy or radiculopathy, cervical region: Secondary | ICD-10-CM | POA: Diagnosis not present

## 2021-04-30 DIAGNOSIS — M7551 Bursitis of right shoulder: Secondary | ICD-10-CM | POA: Diagnosis not present

## 2021-04-30 DIAGNOSIS — M9901 Segmental and somatic dysfunction of cervical region: Secondary | ICD-10-CM | POA: Diagnosis not present

## 2021-04-30 DIAGNOSIS — M47812 Spondylosis without myelopathy or radiculopathy, cervical region: Secondary | ICD-10-CM | POA: Diagnosis not present

## 2021-05-28 DIAGNOSIS — M47812 Spondylosis without myelopathy or radiculopathy, cervical region: Secondary | ICD-10-CM | POA: Diagnosis not present

## 2021-05-28 DIAGNOSIS — M9901 Segmental and somatic dysfunction of cervical region: Secondary | ICD-10-CM | POA: Diagnosis not present

## 2021-05-28 DIAGNOSIS — M7551 Bursitis of right shoulder: Secondary | ICD-10-CM | POA: Diagnosis not present

## 2021-06-25 DIAGNOSIS — M47812 Spondylosis without myelopathy or radiculopathy, cervical region: Secondary | ICD-10-CM | POA: Diagnosis not present

## 2021-06-25 DIAGNOSIS — M7551 Bursitis of right shoulder: Secondary | ICD-10-CM | POA: Diagnosis not present

## 2021-06-25 DIAGNOSIS — M9901 Segmental and somatic dysfunction of cervical region: Secondary | ICD-10-CM | POA: Diagnosis not present

## 2021-06-29 DIAGNOSIS — M810 Age-related osteoporosis without current pathological fracture: Secondary | ICD-10-CM | POA: Diagnosis not present

## 2021-09-21 DIAGNOSIS — I10 Essential (primary) hypertension: Secondary | ICD-10-CM | POA: Diagnosis not present

## 2021-09-21 DIAGNOSIS — W19XXXA Unspecified fall, initial encounter: Secondary | ICD-10-CM | POA: Diagnosis not present

## 2021-09-21 DIAGNOSIS — S134XXA Sprain of ligaments of cervical spine, initial encounter: Secondary | ICD-10-CM | POA: Diagnosis not present

## 2021-09-21 DIAGNOSIS — Z299 Encounter for prophylactic measures, unspecified: Secondary | ICD-10-CM | POA: Diagnosis not present

## 2021-09-21 DIAGNOSIS — Z6823 Body mass index (BMI) 23.0-23.9, adult: Secondary | ICD-10-CM | POA: Diagnosis not present

## 2021-09-21 DIAGNOSIS — S0990XA Unspecified injury of head, initial encounter: Secondary | ICD-10-CM | POA: Diagnosis not present

## 2021-09-23 ENCOUNTER — Other Ambulatory Visit (HOSPITAL_COMMUNITY): Payer: Self-pay | Admitting: Family Medicine

## 2021-09-23 ENCOUNTER — Other Ambulatory Visit: Payer: Self-pay | Admitting: Family Medicine

## 2021-09-23 DIAGNOSIS — W19XXXA Unspecified fall, initial encounter: Secondary | ICD-10-CM

## 2021-09-23 DIAGNOSIS — S0990XA Unspecified injury of head, initial encounter: Secondary | ICD-10-CM

## 2021-09-23 DIAGNOSIS — S134XXA Sprain of ligaments of cervical spine, initial encounter: Secondary | ICD-10-CM

## 2021-09-28 ENCOUNTER — Encounter (HOSPITAL_BASED_OUTPATIENT_CLINIC_OR_DEPARTMENT_OTHER): Payer: Self-pay

## 2021-09-28 ENCOUNTER — Ambulatory Visit (HOSPITAL_BASED_OUTPATIENT_CLINIC_OR_DEPARTMENT_OTHER)
Admission: RE | Admit: 2021-09-28 | Discharge: 2021-09-28 | Disposition: A | Discharge status: Home / Self Care | Payer: Medicare Other | Source: Ambulatory Visit | Attending: Family Medicine | Admitting: Family Medicine

## 2021-09-28 DIAGNOSIS — S134XXA Sprain of ligaments of cervical spine, initial encounter: Secondary | ICD-10-CM

## 2021-09-28 DIAGNOSIS — S0990XA Unspecified injury of head, initial encounter: Secondary | ICD-10-CM

## 2021-09-28 DIAGNOSIS — M542 Cervicalgia: Secondary | ICD-10-CM | POA: Diagnosis not present

## 2021-09-28 DIAGNOSIS — W19XXXA Unspecified fall, initial encounter: Secondary | ICD-10-CM

## 2021-09-28 DIAGNOSIS — R519 Headache, unspecified: Secondary | ICD-10-CM | POA: Diagnosis not present

## 2021-09-29 DIAGNOSIS — Z6824 Body mass index (BMI) 24.0-24.9, adult: Secondary | ICD-10-CM | POA: Diagnosis not present

## 2021-09-29 DIAGNOSIS — Z789 Other specified health status: Secondary | ICD-10-CM | POA: Diagnosis not present

## 2021-09-29 DIAGNOSIS — Z299 Encounter for prophylactic measures, unspecified: Secondary | ICD-10-CM | POA: Diagnosis not present

## 2021-09-29 DIAGNOSIS — Z713 Dietary counseling and surveillance: Secondary | ICD-10-CM | POA: Diagnosis not present

## 2021-09-29 DIAGNOSIS — I10 Essential (primary) hypertension: Secondary | ICD-10-CM | POA: Diagnosis not present

## 2021-11-11 DIAGNOSIS — I10 Essential (primary) hypertension: Secondary | ICD-10-CM | POA: Diagnosis not present

## 2021-11-11 DIAGNOSIS — Z6823 Body mass index (BMI) 23.0-23.9, adult: Secondary | ICD-10-CM | POA: Diagnosis not present

## 2021-11-11 DIAGNOSIS — Z79899 Other long term (current) drug therapy: Secondary | ICD-10-CM | POA: Diagnosis not present

## 2021-11-11 DIAGNOSIS — Z789 Other specified health status: Secondary | ICD-10-CM | POA: Diagnosis not present

## 2021-11-11 DIAGNOSIS — R930 Abnormal findings on diagnostic imaging of skull and head, not elsewhere classified: Secondary | ICD-10-CM | POA: Diagnosis not present

## 2021-11-11 DIAGNOSIS — Z0000 Encounter for general adult medical examination without abnormal findings: Secondary | ICD-10-CM | POA: Diagnosis not present

## 2021-11-11 DIAGNOSIS — E7800 Pure hypercholesterolemia, unspecified: Secondary | ICD-10-CM | POA: Diagnosis not present

## 2021-11-11 DIAGNOSIS — Z7189 Other specified counseling: Secondary | ICD-10-CM | POA: Diagnosis not present

## 2021-11-11 DIAGNOSIS — R5383 Other fatigue: Secondary | ICD-10-CM | POA: Diagnosis not present

## 2021-11-11 DIAGNOSIS — Z299 Encounter for prophylactic measures, unspecified: Secondary | ICD-10-CM | POA: Diagnosis not present

## 2021-11-11 DIAGNOSIS — R739 Hyperglycemia, unspecified: Secondary | ICD-10-CM | POA: Diagnosis not present

## 2021-11-22 DIAGNOSIS — I671 Cerebral aneurysm, nonruptured: Secondary | ICD-10-CM | POA: Diagnosis not present

## 2021-11-23 DIAGNOSIS — I671 Cerebral aneurysm, nonruptured: Secondary | ICD-10-CM | POA: Diagnosis not present

## 2021-11-23 DIAGNOSIS — Z299 Encounter for prophylactic measures, unspecified: Secondary | ICD-10-CM | POA: Diagnosis not present

## 2021-11-23 DIAGNOSIS — I10 Essential (primary) hypertension: Secondary | ICD-10-CM | POA: Diagnosis not present

## 2021-12-01 ENCOUNTER — Other Ambulatory Visit (HOSPITAL_COMMUNITY): Payer: Self-pay | Admitting: Interventional Radiology

## 2021-12-01 DIAGNOSIS — I671 Cerebral aneurysm, nonruptured: Secondary | ICD-10-CM

## 2021-12-03 ENCOUNTER — Ambulatory Visit (HOSPITAL_COMMUNITY)
Admission: RE | Admit: 2021-12-03 | Discharge: 2021-12-03 | Disposition: A | Discharge status: Home / Self Care | Payer: Medicare Other | Source: Ambulatory Visit | Attending: Interventional Radiology | Admitting: Interventional Radiology

## 2021-12-03 DIAGNOSIS — I671 Cerebral aneurysm, nonruptured: Secondary | ICD-10-CM | POA: Diagnosis not present

## 2021-12-06 HISTORY — PX: IR RADIOLOGIST EVAL & MGMT: IMG5224

## 2021-12-16 DIAGNOSIS — I671 Cerebral aneurysm, nonruptured: Secondary | ICD-10-CM | POA: Diagnosis not present

## 2022-01-05 ENCOUNTER — Other Ambulatory Visit: Payer: Self-pay | Admitting: Internal Medicine

## 2022-01-05 DIAGNOSIS — Z1231 Encounter for screening mammogram for malignant neoplasm of breast: Secondary | ICD-10-CM

## 2022-01-12 DIAGNOSIS — M810 Age-related osteoporosis without current pathological fracture: Secondary | ICD-10-CM | POA: Diagnosis not present

## 2022-02-02 ENCOUNTER — Ambulatory Visit
Admission: RE | Admit: 2022-02-02 | Discharge: 2022-02-02 | Disposition: A | Discharge status: Home / Self Care | Payer: Medicare Other | Source: Ambulatory Visit | Attending: Internal Medicine | Admitting: Internal Medicine

## 2022-02-02 DIAGNOSIS — Z1231 Encounter for screening mammogram for malignant neoplasm of breast: Secondary | ICD-10-CM | POA: Diagnosis not present

## 2022-02-17 DIAGNOSIS — Z299 Encounter for prophylactic measures, unspecified: Secondary | ICD-10-CM | POA: Diagnosis not present

## 2022-02-17 DIAGNOSIS — Z789 Other specified health status: Secondary | ICD-10-CM | POA: Diagnosis not present

## 2022-02-17 DIAGNOSIS — Z6823 Body mass index (BMI) 23.0-23.9, adult: Secondary | ICD-10-CM | POA: Diagnosis not present

## 2022-02-17 DIAGNOSIS — F32A Depression, unspecified: Secondary | ICD-10-CM | POA: Diagnosis not present

## 2022-02-17 DIAGNOSIS — Z0000 Encounter for general adult medical examination without abnormal findings: Secondary | ICD-10-CM | POA: Diagnosis not present

## 2022-02-17 DIAGNOSIS — I10 Essential (primary) hypertension: Secondary | ICD-10-CM | POA: Diagnosis not present

## 2022-02-17 DIAGNOSIS — D509 Iron deficiency anemia, unspecified: Secondary | ICD-10-CM | POA: Diagnosis not present

## 2022-02-17 DIAGNOSIS — E785 Hyperlipidemia, unspecified: Secondary | ICD-10-CM | POA: Diagnosis not present

## 2022-02-17 DIAGNOSIS — E039 Hypothyroidism, unspecified: Secondary | ICD-10-CM | POA: Diagnosis not present

## 2022-02-25 DIAGNOSIS — Z23 Encounter for immunization: Secondary | ICD-10-CM | POA: Diagnosis not present

## 2022-03-04 DIAGNOSIS — X501XXA Overexertion from prolonged static or awkward postures, initial encounter: Secondary | ICD-10-CM | POA: Diagnosis not present

## 2022-03-04 DIAGNOSIS — R519 Headache, unspecified: Secondary | ICD-10-CM | POA: Diagnosis not present

## 2022-03-04 DIAGNOSIS — Z880 Allergy status to penicillin: Secondary | ICD-10-CM | POA: Diagnosis not present

## 2022-03-04 DIAGNOSIS — Z043 Encounter for examination and observation following other accident: Secondary | ICD-10-CM | POA: Diagnosis not present

## 2022-03-04 DIAGNOSIS — S0181XA Laceration without foreign body of other part of head, initial encounter: Secondary | ICD-10-CM | POA: Diagnosis not present

## 2022-03-04 DIAGNOSIS — M47812 Spondylosis without myelopathy or radiculopathy, cervical region: Secondary | ICD-10-CM | POA: Diagnosis not present

## 2022-03-04 DIAGNOSIS — S0990XA Unspecified injury of head, initial encounter: Secondary | ICD-10-CM | POA: Diagnosis not present

## 2022-03-11 DIAGNOSIS — T148XXA Other injury of unspecified body region, initial encounter: Secondary | ICD-10-CM | POA: Diagnosis not present

## 2022-03-11 DIAGNOSIS — I10 Essential (primary) hypertension: Secondary | ICD-10-CM | POA: Diagnosis not present

## 2022-03-11 DIAGNOSIS — G250 Essential tremor: Secondary | ICD-10-CM | POA: Diagnosis not present

## 2022-03-11 DIAGNOSIS — Z299 Encounter for prophylactic measures, unspecified: Secondary | ICD-10-CM | POA: Diagnosis not present

## 2022-05-20 IMAGING — MG MM DIGITAL SCREENING BILAT W/ TOMO AND CAD
8 series · 9 of 24 positions shown · non-contrast
Comparison: Previous exam(s).

CLINICAL DATA: Screening.

EXAM:
DIGITAL SCREENING BILATERAL MAMMOGRAM WITH TOMOSYNTHESIS AND CAD
TECHNIQUE: Bilateral screening digital craniocaudal and mediolateral oblique
mammograms were obtained. Bilateral screening digital breast
tomosynthesis was performed. The images were evaluated with
computer-aided detection.

[R CC synth-2D]
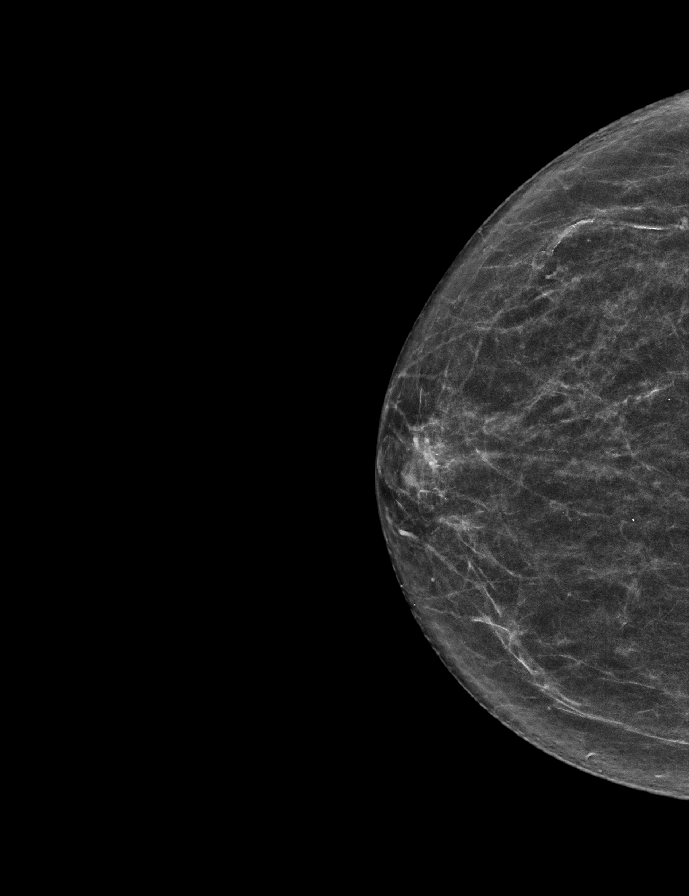

[L CC synth-2D]
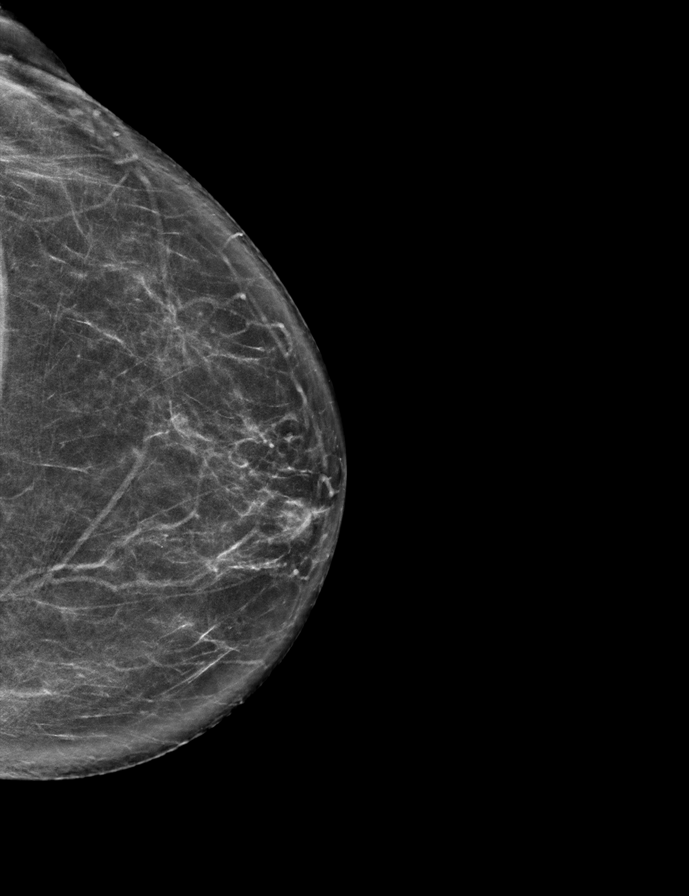

[L MLO synth-2D]
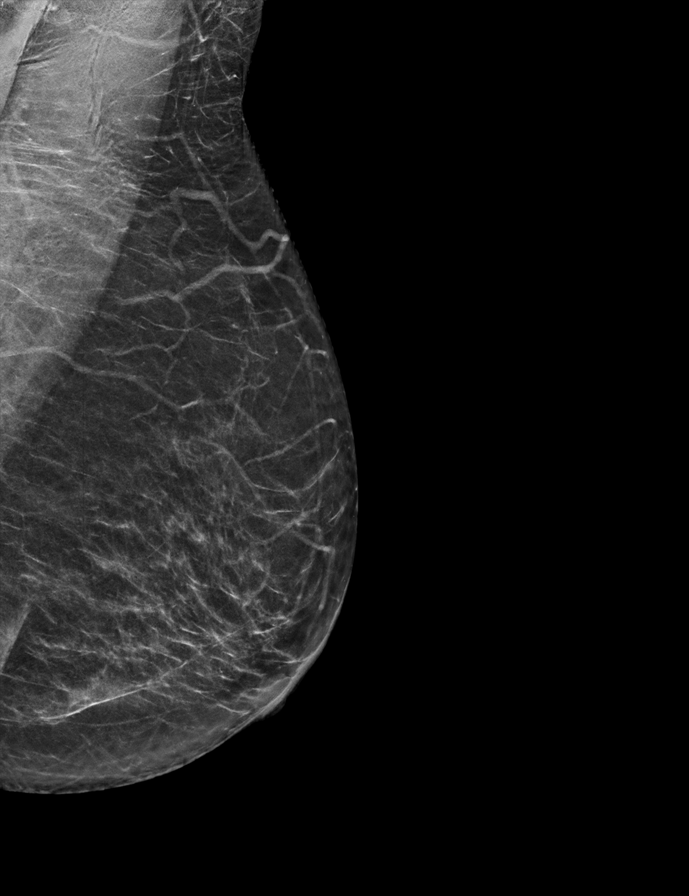

[R MLO synth-2D]
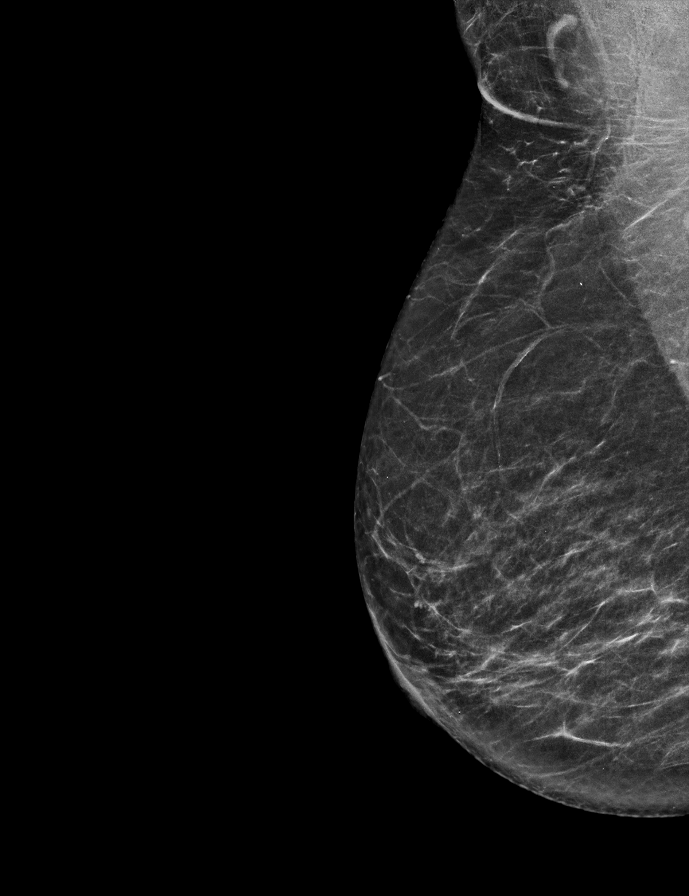

[R CC tomo · 2 of 56 frames shown]
[frame 19/56]
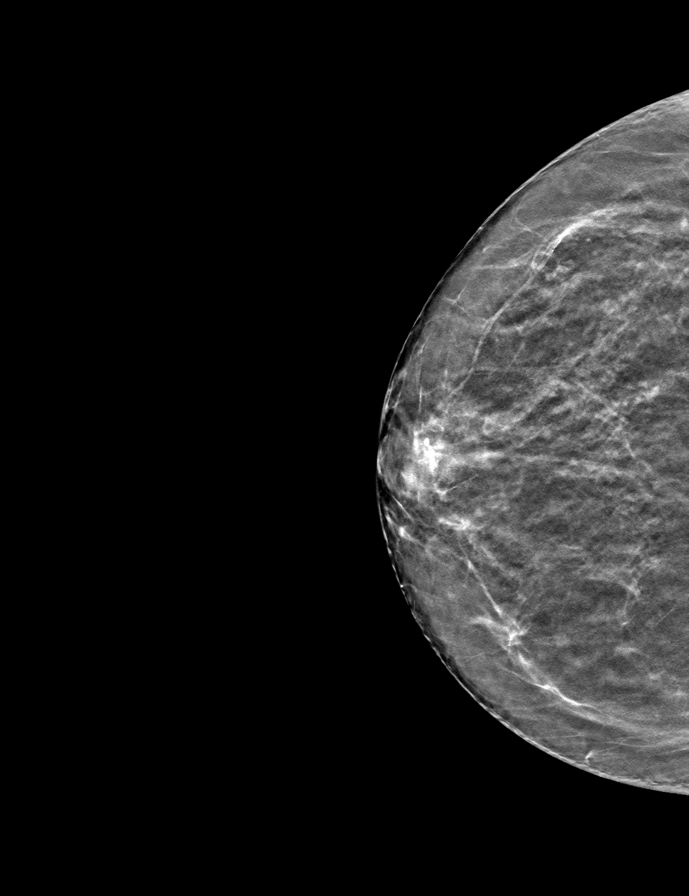
[frame 29/56]
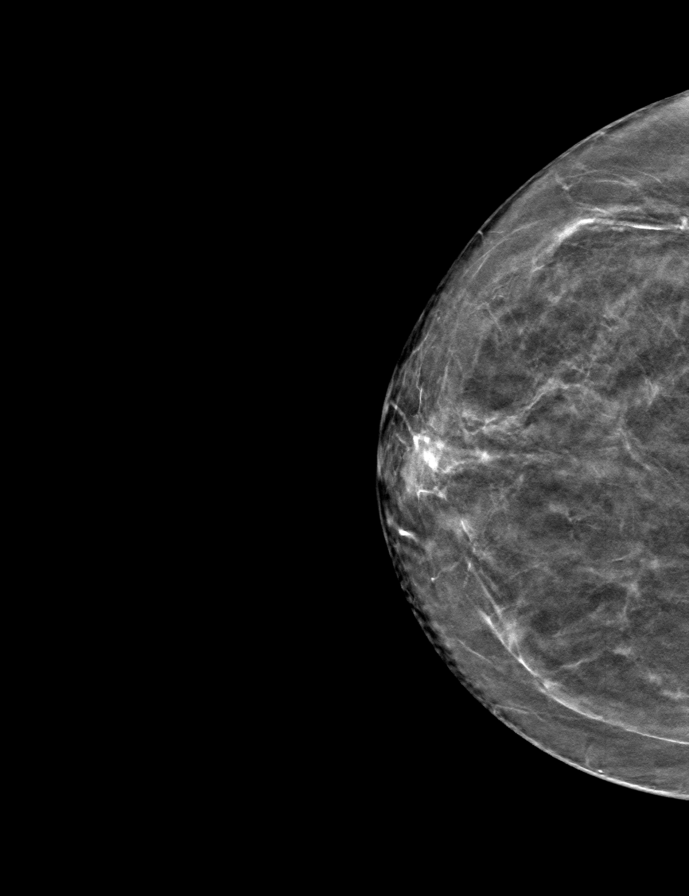

[L MLO tomo · tomo slice 31/61.0]
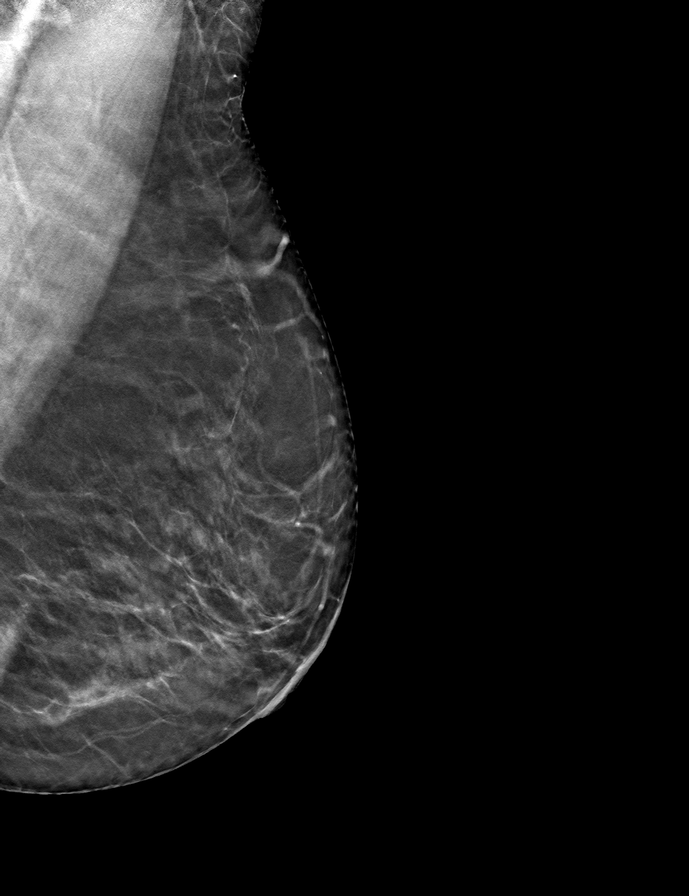

[R MLO tomo · tomo slice 31/60.0]
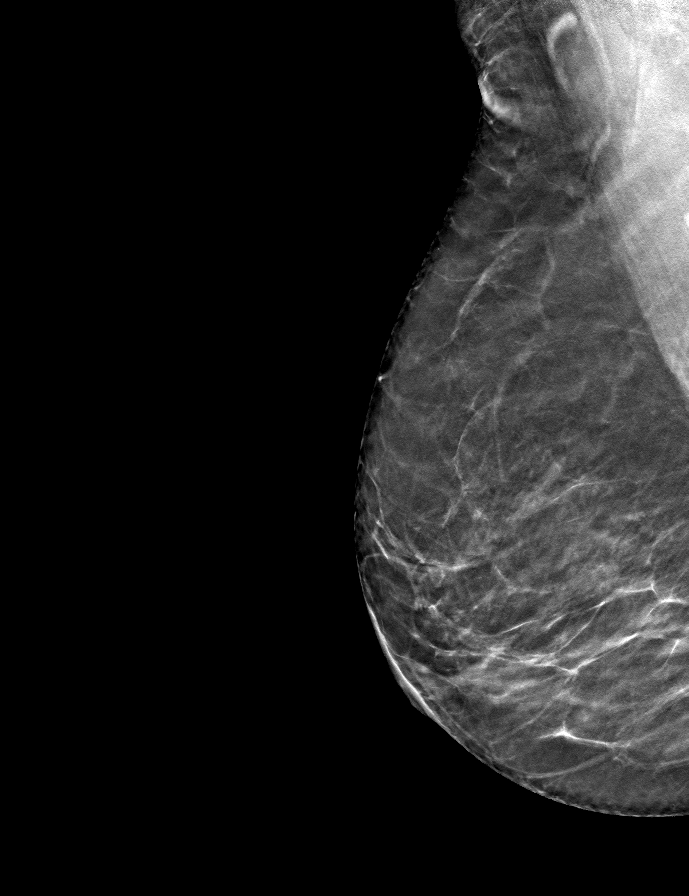

[L CC tomo · tomo slice 33/66.0]
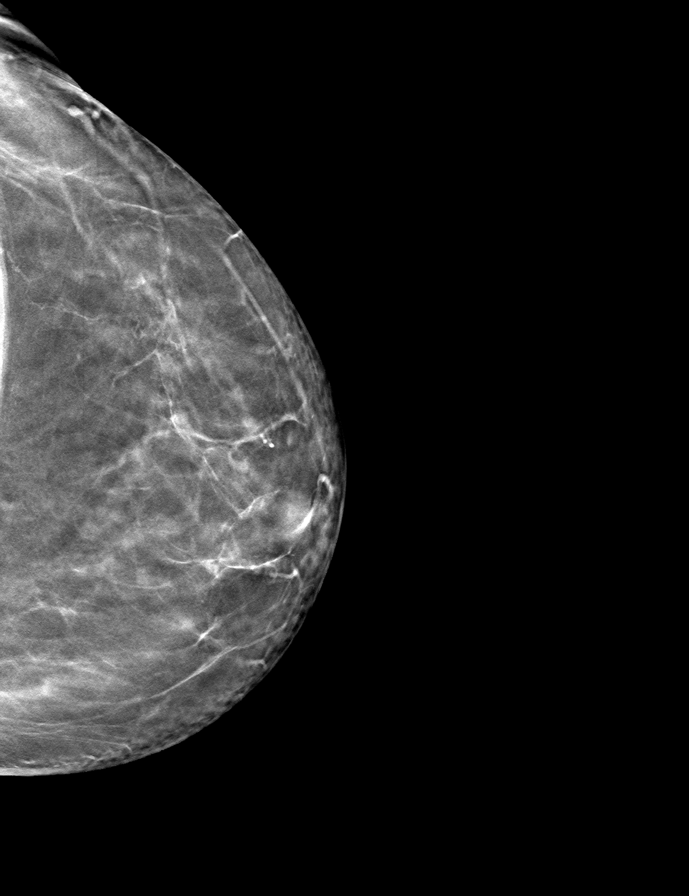

[9 of 24 positions shown; findings below may reference images not displayed]

ACR Breast Density Category b: There are scattered areas of
fibroglandular density.
FINDINGS: There are no findings suspicious for malignancy.
IMPRESSION: No mammographic evidence of malignancy. A result letter of this
screening mammogram will be mailed directly to the patient.

RECOMMENDATION:
Screening mammogram in one year. (Code:51-O-LD2)

BI-RADS CATEGORY  1: Negative.

## 2022-05-30 DIAGNOSIS — H6123 Impacted cerumen, bilateral: Secondary | ICD-10-CM | POA: Diagnosis not present

## 2022-11-10 DIAGNOSIS — Z1212 Encounter for screening for malignant neoplasm of rectum: Secondary | ICD-10-CM | POA: Diagnosis not present

## 2022-11-10 DIAGNOSIS — Z1211 Encounter for screening for malignant neoplasm of colon: Secondary | ICD-10-CM | POA: Diagnosis not present

## 2022-11-23 DIAGNOSIS — Z79899 Other long term (current) drug therapy: Secondary | ICD-10-CM | POA: Diagnosis not present

## 2022-11-23 DIAGNOSIS — E039 Hypothyroidism, unspecified: Secondary | ICD-10-CM | POA: Diagnosis not present

## 2022-11-23 DIAGNOSIS — Z7189 Other specified counseling: Secondary | ICD-10-CM | POA: Diagnosis not present

## 2022-11-23 DIAGNOSIS — R5383 Other fatigue: Secondary | ICD-10-CM | POA: Diagnosis not present

## 2022-11-23 DIAGNOSIS — Z0000 Encounter for general adult medical examination without abnormal findings: Secondary | ICD-10-CM | POA: Diagnosis not present

## 2022-11-23 DIAGNOSIS — E7800 Pure hypercholesterolemia, unspecified: Secondary | ICD-10-CM | POA: Diagnosis not present

## 2022-11-23 DIAGNOSIS — M810 Age-related osteoporosis without current pathological fracture: Secondary | ICD-10-CM | POA: Diagnosis not present

## 2022-11-23 DIAGNOSIS — I10 Essential (primary) hypertension: Secondary | ICD-10-CM | POA: Diagnosis not present

## 2022-11-23 DIAGNOSIS — Z299 Encounter for prophylactic measures, unspecified: Secondary | ICD-10-CM | POA: Diagnosis not present

## 2022-12-16 DIAGNOSIS — Z79899 Other long term (current) drug therapy: Secondary | ICD-10-CM | POA: Diagnosis not present

## 2022-12-16 DIAGNOSIS — M818 Other osteoporosis without current pathological fracture: Secondary | ICD-10-CM | POA: Diagnosis not present

## 2022-12-19 DIAGNOSIS — R251 Tremor, unspecified: Secondary | ICD-10-CM | POA: Diagnosis not present

## 2022-12-19 DIAGNOSIS — Z79899 Other long term (current) drug therapy: Secondary | ICD-10-CM | POA: Diagnosis not present

## 2022-12-19 DIAGNOSIS — I671 Cerebral aneurysm, nonruptured: Secondary | ICD-10-CM | POA: Diagnosis not present

## 2023-01-04 DIAGNOSIS — I10 Essential (primary) hypertension: Secondary | ICD-10-CM | POA: Diagnosis not present

## 2023-01-04 DIAGNOSIS — Z0000 Encounter for general adult medical examination without abnormal findings: Secondary | ICD-10-CM | POA: Diagnosis not present

## 2023-01-04 DIAGNOSIS — Z6823 Body mass index (BMI) 23.0-23.9, adult: Secondary | ICD-10-CM | POA: Diagnosis not present

## 2023-01-04 DIAGNOSIS — Z299 Encounter for prophylactic measures, unspecified: Secondary | ICD-10-CM | POA: Diagnosis not present

## 2023-01-18 DIAGNOSIS — M810 Age-related osteoporosis without current pathological fracture: Secondary | ICD-10-CM | POA: Diagnosis not present

## 2023-01-26 DIAGNOSIS — M810 Age-related osteoporosis without current pathological fracture: Secondary | ICD-10-CM | POA: Diagnosis not present

## 2023-02-01 DIAGNOSIS — G20A1 Parkinson's disease without dyskinesia, without mention of fluctuations: Secondary | ICD-10-CM | POA: Diagnosis not present

## 2023-02-01 DIAGNOSIS — R251 Tremor, unspecified: Secondary | ICD-10-CM | POA: Diagnosis not present

## 2023-03-14 DIAGNOSIS — I10 Essential (primary) hypertension: Secondary | ICD-10-CM | POA: Diagnosis not present

## 2023-03-14 DIAGNOSIS — Z299 Encounter for prophylactic measures, unspecified: Secondary | ICD-10-CM | POA: Diagnosis not present

## 2023-03-14 DIAGNOSIS — Z6824 Body mass index (BMI) 24.0-24.9, adult: Secondary | ICD-10-CM | POA: Diagnosis not present

## 2023-03-14 DIAGNOSIS — Z23 Encounter for immunization: Secondary | ICD-10-CM | POA: Diagnosis not present

## 2023-03-14 DIAGNOSIS — G4752 REM sleep behavior disorder: Secondary | ICD-10-CM | POA: Diagnosis not present

## 2023-03-14 DIAGNOSIS — G20A1 Parkinson's disease without dyskinesia, without mention of fluctuations: Secondary | ICD-10-CM | POA: Diagnosis not present

## 2023-06-15 DIAGNOSIS — G20A1 Parkinson's disease without dyskinesia, without mention of fluctuations: Secondary | ICD-10-CM | POA: Diagnosis not present

## 2023-06-15 DIAGNOSIS — Z299 Encounter for prophylactic measures, unspecified: Secondary | ICD-10-CM | POA: Diagnosis not present

## 2023-06-15 DIAGNOSIS — I10 Essential (primary) hypertension: Secondary | ICD-10-CM | POA: Diagnosis not present

## 2023-07-21 DIAGNOSIS — M810 Age-related osteoporosis without current pathological fracture: Secondary | ICD-10-CM | POA: Diagnosis not present

## 2023-08-02 DIAGNOSIS — M810 Age-related osteoporosis without current pathological fracture: Secondary | ICD-10-CM | POA: Diagnosis not present

## 2023-08-22 DIAGNOSIS — G20A1 Parkinson's disease without dyskinesia, without mention of fluctuations: Secondary | ICD-10-CM | POA: Diagnosis not present

## 2023-09-13 DIAGNOSIS — R0683 Snoring: Secondary | ICD-10-CM | POA: Diagnosis not present

## 2023-09-13 DIAGNOSIS — I10 Essential (primary) hypertension: Secondary | ICD-10-CM | POA: Diagnosis not present

## 2023-09-13 DIAGNOSIS — G20A1 Parkinson's disease without dyskinesia, without mention of fluctuations: Secondary | ICD-10-CM | POA: Diagnosis not present

## 2023-09-13 DIAGNOSIS — Z299 Encounter for prophylactic measures, unspecified: Secondary | ICD-10-CM | POA: Diagnosis not present

## 2023-09-13 DIAGNOSIS — G4700 Insomnia, unspecified: Secondary | ICD-10-CM | POA: Diagnosis not present

## 2023-09-29 DIAGNOSIS — G4730 Sleep apnea, unspecified: Secondary | ICD-10-CM | POA: Diagnosis not present

## 2023-10-12 DIAGNOSIS — G20A1 Parkinson's disease without dyskinesia, without mention of fluctuations: Secondary | ICD-10-CM | POA: Diagnosis not present

## 2023-10-12 DIAGNOSIS — G4752 REM sleep behavior disorder: Secondary | ICD-10-CM | POA: Diagnosis not present

## 2023-10-21 ENCOUNTER — Encounter (HOSPITAL_COMMUNITY): Payer: Self-pay | Admitting: Interventional Radiology

## 2023-11-29 DIAGNOSIS — E7800 Pure hypercholesterolemia, unspecified: Secondary | ICD-10-CM | POA: Diagnosis not present

## 2023-11-29 DIAGNOSIS — Z7189 Other specified counseling: Secondary | ICD-10-CM | POA: Diagnosis not present

## 2023-11-29 DIAGNOSIS — Z0000 Encounter for general adult medical examination without abnormal findings: Secondary | ICD-10-CM | POA: Diagnosis not present

## 2023-11-29 DIAGNOSIS — Z1389 Encounter for screening for other disorder: Secondary | ICD-10-CM | POA: Diagnosis not present

## 2023-11-29 DIAGNOSIS — I10 Essential (primary) hypertension: Secondary | ICD-10-CM | POA: Diagnosis not present

## 2023-11-29 DIAGNOSIS — R5383 Other fatigue: Secondary | ICD-10-CM | POA: Diagnosis not present

## 2023-11-29 DIAGNOSIS — Z79899 Other long term (current) drug therapy: Secondary | ICD-10-CM | POA: Diagnosis not present

## 2023-11-29 DIAGNOSIS — Z299 Encounter for prophylactic measures, unspecified: Secondary | ICD-10-CM | POA: Diagnosis not present

## 2023-11-29 DIAGNOSIS — R52 Pain, unspecified: Secondary | ICD-10-CM | POA: Diagnosis not present

## 2023-11-29 DIAGNOSIS — M810 Age-related osteoporosis without current pathological fracture: Secondary | ICD-10-CM | POA: Diagnosis not present

## 2023-11-29 DIAGNOSIS — E039 Hypothyroidism, unspecified: Secondary | ICD-10-CM | POA: Diagnosis not present

## 2024-01-03 ENCOUNTER — Other Ambulatory Visit: Payer: Self-pay | Admitting: Internal Medicine

## 2024-01-03 DIAGNOSIS — Z1231 Encounter for screening mammogram for malignant neoplasm of breast: Secondary | ICD-10-CM

## 2024-01-09 DIAGNOSIS — G20A1 Parkinson's disease without dyskinesia, without mention of fluctuations: Secondary | ICD-10-CM | POA: Diagnosis not present

## 2024-01-09 DIAGNOSIS — Z0000 Encounter for general adult medical examination without abnormal findings: Secondary | ICD-10-CM | POA: Diagnosis not present

## 2024-01-09 DIAGNOSIS — Z23 Encounter for immunization: Secondary | ICD-10-CM | POA: Diagnosis not present

## 2024-01-09 DIAGNOSIS — I10 Essential (primary) hypertension: Secondary | ICD-10-CM | POA: Diagnosis not present

## 2024-01-09 DIAGNOSIS — Z299 Encounter for prophylactic measures, unspecified: Secondary | ICD-10-CM | POA: Diagnosis not present

## 2024-01-09 DIAGNOSIS — R52 Pain, unspecified: Secondary | ICD-10-CM | POA: Diagnosis not present

## 2024-01-16 ENCOUNTER — Ambulatory Visit
Admission: RE | Admit: 2024-01-16 | Discharge: 2024-01-16 | Disposition: A | Discharge status: Home / Self Care | Source: Ambulatory Visit | Attending: Internal Medicine | Admitting: Internal Medicine

## 2024-01-16 DIAGNOSIS — Z1231 Encounter for screening mammogram for malignant neoplasm of breast: Secondary | ICD-10-CM
